# Patient Record
Sex: Female | Born: 1960 | Race: White | Hispanic: No | Marital: Married | Smoking: Current every day smoker
Health system: Southern US, Community
[De-identification: ages and names within clinical notes are randomized; demographics above are authoritative.]

## PROBLEM LIST (undated history)

## (undated) DIAGNOSIS — K219 Gastro-esophageal reflux disease without esophagitis: Secondary | ICD-10-CM

## (undated) DIAGNOSIS — J45990 Exercise induced bronchospasm: Secondary | ICD-10-CM

## (undated) DIAGNOSIS — C801 Malignant (primary) neoplasm, unspecified: Secondary | ICD-10-CM

## (undated) DIAGNOSIS — C569 Malignant neoplasm of unspecified ovary: Secondary | ICD-10-CM

## (undated) DIAGNOSIS — F419 Anxiety disorder, unspecified: Secondary | ICD-10-CM

## (undated) DIAGNOSIS — N289 Disorder of kidney and ureter, unspecified: Secondary | ICD-10-CM

## (undated) HISTORY — DX: Anxiety disorder, unspecified: F41.9

## (undated) HISTORY — DX: Disorder of kidney and ureter, unspecified: N28.9

## (undated) HISTORY — DX: Exercise induced bronchospasm: J45.990

## (undated) HISTORY — DX: Gastro-esophageal reflux disease without esophagitis: K21.9

## (undated) HISTORY — DX: Malignant neoplasm of unspecified ovary: C56.9

---
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
## 2004-10-05 ENCOUNTER — Ambulatory Visit: Payer: Self-pay | Admitting: Family Medicine

## 2005-04-20 ENCOUNTER — Ambulatory Visit: Payer: Self-pay | Admitting: Family Medicine

## 2005-05-19 ENCOUNTER — Ambulatory Visit: Payer: Self-pay | Admitting: Family Medicine

## 2005-07-05 ENCOUNTER — Ambulatory Visit: Payer: Self-pay | Admitting: Family Medicine

## 2012-11-15 ENCOUNTER — Ambulatory Visit: Payer: Self-pay

## 2013-03-16 ENCOUNTER — Ambulatory Visit: Payer: Self-pay

## 2014-04-22 ENCOUNTER — Ambulatory Visit: Payer: Self-pay | Admitting: Gastroenterology

## 2015-02-23 ENCOUNTER — Other Ambulatory Visit: Payer: Self-pay | Admitting: Unknown Physician Specialty

## 2015-03-18 DIAGNOSIS — F419 Anxiety disorder, unspecified: Secondary | ICD-10-CM

## 2015-03-18 DIAGNOSIS — K219 Gastro-esophageal reflux disease without esophagitis: Secondary | ICD-10-CM

## 2015-03-18 DIAGNOSIS — J45990 Exercise induced bronchospasm: Secondary | ICD-10-CM

## 2015-03-18 HISTORY — DX: Gastro-esophageal reflux disease without esophagitis: K21.9

## 2015-03-18 HISTORY — DX: Anxiety disorder, unspecified: F41.9

## 2015-03-18 HISTORY — DX: Exercise induced bronchospasm: J45.990

## 2015-05-06 ENCOUNTER — Other Ambulatory Visit: Payer: Self-pay | Admitting: Unknown Physician Specialty

## 2015-08-03 ENCOUNTER — Other Ambulatory Visit: Payer: Self-pay | Admitting: Family Medicine

## 2015-08-04 NOTE — Telephone Encounter (Signed)
Apt cw 

## 2015-08-04 NOTE — Telephone Encounter (Signed)
Needs appt with Malachy Mood

## 2015-11-02 ENCOUNTER — Other Ambulatory Visit: Payer: Self-pay | Admitting: Family Medicine

## 2015-11-02 NOTE — Telephone Encounter (Signed)
apt 

## 2015-11-04 ENCOUNTER — Encounter: Payer: Self-pay | Admitting: Family Medicine

## 2015-11-11 DIAGNOSIS — Z131 Encounter for screening for diabetes mellitus: Secondary | ICD-10-CM | POA: Insufficient documentation

## 2015-11-11 DIAGNOSIS — Z13220 Encounter for screening for lipoid disorders: Secondary | ICD-10-CM | POA: Insufficient documentation

## 2015-11-11 DIAGNOSIS — Z1159 Encounter for screening for other viral diseases: Secondary | ICD-10-CM | POA: Insufficient documentation

## 2016-01-12 DIAGNOSIS — N289 Disorder of kidney and ureter, unspecified: Secondary | ICD-10-CM

## 2016-01-12 HISTORY — DX: Disorder of kidney and ureter, unspecified: N28.9

## 2016-01-21 ENCOUNTER — Ambulatory Visit
Admission: RE | Admit: 2016-01-21 | Discharge: 2016-01-21 | Disposition: A | Discharge status: Home / Self Care | Payer: BLUE CROSS/BLUE SHIELD | Source: Ambulatory Visit | Attending: Physician Assistant | Admitting: Physician Assistant

## 2016-01-21 ENCOUNTER — Other Ambulatory Visit: Payer: Self-pay | Admitting: Physician Assistant

## 2016-01-21 DIAGNOSIS — N289 Disorder of kidney and ureter, unspecified: Secondary | ICD-10-CM

## 2016-01-21 DIAGNOSIS — R918 Other nonspecific abnormal finding of lung field: Secondary | ICD-10-CM

## 2016-01-21 DIAGNOSIS — M545 Low back pain, unspecified: Secondary | ICD-10-CM

## 2016-01-21 DIAGNOSIS — M5136 Other intervertebral disc degeneration, lumbar region: Secondary | ICD-10-CM | POA: Diagnosis not present

## 2016-01-21 DIAGNOSIS — R109 Unspecified abdominal pain: Secondary | ICD-10-CM | POA: Diagnosis present

## 2016-01-21 DIAGNOSIS — R195 Other fecal abnormalities: Secondary | ICD-10-CM | POA: Insufficient documentation

## 2016-01-21 DIAGNOSIS — D49519 Neoplasm of unspecified behavior of unspecified kidney: Secondary | ICD-10-CM

## 2016-01-21 DIAGNOSIS — R93421 Abnormal radiologic findings on diagnostic imaging of right kidney: Secondary | ICD-10-CM | POA: Insufficient documentation

## 2016-01-21 DIAGNOSIS — N200 Calculus of kidney: Secondary | ICD-10-CM | POA: Insufficient documentation

## 2016-01-22 ENCOUNTER — Ambulatory Visit
Admission: RE | Admit: 2016-01-22 | Discharge: 2016-01-22 | Disposition: A | Discharge status: Home / Self Care | Payer: BLUE CROSS/BLUE SHIELD | Source: Ambulatory Visit | Attending: Physician Assistant | Admitting: Physician Assistant

## 2016-01-22 ENCOUNTER — Telehealth: Payer: Self-pay

## 2016-01-22 DIAGNOSIS — R918 Other nonspecific abnormal finding of lung field: Secondary | ICD-10-CM | POA: Diagnosis present

## 2016-01-22 DIAGNOSIS — D49519 Neoplasm of unspecified behavior of unspecified kidney: Secondary | ICD-10-CM | POA: Diagnosis not present

## 2016-01-22 DIAGNOSIS — I2510 Atherosclerotic heart disease of native coronary artery without angina pectoris: Secondary | ICD-10-CM | POA: Diagnosis not present

## 2016-01-22 DIAGNOSIS — M899 Disorder of bone, unspecified: Secondary | ICD-10-CM | POA: Insufficient documentation

## 2016-01-22 DIAGNOSIS — M545 Low back pain, unspecified: Secondary | ICD-10-CM

## 2016-01-22 DIAGNOSIS — I700 Atherosclerosis of aorta: Secondary | ICD-10-CM | POA: Insufficient documentation

## 2016-01-22 DIAGNOSIS — R93421 Abnormal radiologic findings on diagnostic imaging of right kidney: Secondary | ICD-10-CM | POA: Insufficient documentation

## 2016-01-22 DIAGNOSIS — R109 Unspecified abdominal pain: Secondary | ICD-10-CM

## 2016-01-22 DIAGNOSIS — R932 Abnormal findings on diagnostic imaging of liver and biliary tract: Secondary | ICD-10-CM | POA: Insufficient documentation

## 2016-01-22 HISTORY — DX: Malignant (primary) neoplasm, unspecified: C80.1

## 2016-01-22 MED ORDER — IOPAMIDOL (ISOVUE-300) INJECTION 61%
100.0000 mL | Freq: Once | INTRAVENOUS | Status: AC | PRN
  Administered 2016-01-22: 100 mL via INTRAVENOUS

## 2016-01-22 NOTE — Telephone Encounter (Signed)
  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 Appt has been arranged with Dr Mike Gip 9/12 at 9:15. Voicemail left for Kimberly [name] to return call to notify of appt.

## 2016-01-22 NOTE — Telephone Encounter (Signed)
  Oncology Nurse Navigator Documentation  Navigator Location: [facility] (01/22/16 1626) Navigator Encounter Type: Telephone (01/22/16 1626) Telephone: Incoming Call;Appt Confirmation/Clarification (01/22/16 1626)                                        Time Spent with Patient: 15 (01/22/16 1626)   Notified of appt 9/12 at 9:15 with arrival time of 9:00 with Dr Mike Gip. Readback performed.

## 2016-01-23 ENCOUNTER — Telehealth: Payer: Self-pay | Admitting: Radiology

## 2016-01-23 ENCOUNTER — Encounter: Payer: Self-pay | Admitting: Urology

## 2016-01-23 ENCOUNTER — Ambulatory Visit (INDEPENDENT_AMBULATORY_CARE_PROVIDER_SITE_OTHER): Payer: BLUE CROSS/BLUE SHIELD | Admitting: Urology

## 2016-01-23 VITALS — BP 171/108 | HR 81 | Ht 65.0 in | Wt 143.0 lb

## 2016-01-23 DIAGNOSIS — N2889 Other specified disorders of kidney and ureter: Secondary | ICD-10-CM

## 2016-01-23 DIAGNOSIS — C7800 Secondary malignant neoplasm of unspecified lung: Secondary | ICD-10-CM | POA: Diagnosis not present

## 2016-01-23 DIAGNOSIS — C801 Malignant (primary) neoplasm, unspecified: Secondary | ICD-10-CM

## 2016-01-23 NOTE — Progress Notes (Signed)
01/23/2016 4:37 PM   Kimberly [name] 1960/07/14 [id number]  Referring provider: Glendon Axe, MD [facility] [address], [place] [address]  Chief Complaint  Patient presents with  . 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 She also has evidence of renal vein involvement. CT scan is suspicious for differential including primary transitional cell carcinoma versus renal cell carcinoma.  She also has a appointment scheduled at the cancer center with Dr. Mike Gip on September 12.  She has had right flank pain for the past 4 months.  At first, she thought she injured something in her back while helping her mother move.  Her pain failed to resolve.  She describes the pain as nagging and pulsatile.    She does have a personal history ovarian cancer as teenage s/p hysterectomy, [place] and radiation.  She had no recurrent.  Stage unknown.  Previously followed by Dr. Jeb Levering.    She is a smoker, smoked as much as 1 pack per week.  Quit recently.    Decreased appetitive with minimal weight loss. No nightsweats.  Excellent functional status.    Denies urinary symptoms.     PMH: Past Medical History:  Diagnosis Date  . Anxiety 03/18/2015  . Cancer ([place])    Ovarian cancer at age 45  . Exercise-induced asthma 03/18/2015  . Gastroesophageal reflux disease without esophagitis 03/18/2015  . Renal insufficiency 01/12/2016    Surgical History: Past Surgical History:  Procedure Laterality Date  . ABDOMINAL HYSTERECTOMY  1979    Home Medications:    Medication List       Accurate as of 01/23/16 11:59 PM. Always use your most recent med list.          AMITIZA PO Take by mouth.   citalopram 10 MG tablet Commonly known as:  CELEXA   fentaNYL 12 MCG/HR Commonly known as:  DURAGESIC - dosed  mcg/hr   montelukast 10 MG tablet Commonly known as:  SINGULAIR TAKE 1 TABLET DAILY   pantoprazole 40 MG tablet Commonly known as:  PROTONIX   PROVENTIL HFA 108 (90 Base) MCG/ACT inhaler Generic drug:  albuterol USE 2 INHALATIONS FOUR TIMES A DAY AS NEEDED       Allergies: No Known Allergies  Family History: Family History  Problem Relation Age of Onset  . Prostate cancer Neg Hx   . Bladder Cancer Neg Hx   . Kidney cancer Neg Hx     Social History:  reports that she has been smoking Cigarettes.  She has been smoking about 0.25 packs per day. She has never used smokeless tobacco. She reports that she drinks alcohol. She reports that she does not use drugs.  ROS: UROLOGY Frequent Urination?: No Hard to postpone urination?: No Burning/pain with urination?: No Get up at night to urinate?: No Leakage of urine?: No Urine stream starts and stops?: No Trouble starting stream?: No Do you have to strain to urinate?: No Blood in urine?: No Urinary tract infection?: No Sexually transmitted disease?: No Injury to kidneys or bladder?: No Painful intercourse?: No Weak stream?: No Currently pregnant?: No Vaginal bleeding?: No Last menstrual period?: hysterectomy  Gastrointestinal Nausea?: No Vomiting?: No Indigestion/heartburn?: Yes Diarrhea?: No Constipation?: Yes  Constitutional Fever: No Night sweats?: No Weight loss?: No Fatigue?: No  Skin Skin rash/lesions?: No Itching?:  No  Eyes Blurred vision?: No Double vision?: No  Ears/Nose/Throat Sore throat?: No Sinus problems?: No  Hematologic/Lymphatic Swollen glands?: No Easy bruising?: No  Cardiovascular Leg swelling?: No Chest pain?: No  Respiratory Cough?: No Shortness of breath?: No  Endocrine Excessive thirst?: No  Musculoskeletal Back pain?: No Joint pain?: No  Neurological Headaches?: No Dizziness?: No  Psychologic Depression?: Yes Anxiety?: Yes  Physical Exam: BP (!) 171/108    Pulse 81   Ht 5' 5"  (1.651 m)   Wt 143 lb (64.9 kg)   BMI 23.80 kg/m   Constitutional:  Alert and oriented, No acute distress.  Presents with husband. HEENT: [place] AT, moist mucus membranes.  Trachea midline, no masses. Cardiovascular: No clubbing, cyanosis, or edema. Respiratory: Normal respiratory effort, no increased work of breathing. GI: Abdomen is soft, nontender, nondistended, no abdominal masses GU: No CVA tenderness.  Skin: No rashes, bruises or suspicious lesions. Neurologic: Grossly intact, no focal deficits, moving all 4 extremities. Psychiatric: Normal mood and affect.  Laboratory Data: Comprehensive Metabolic Panel (CMP) (42/70/6237 8:05 AM) Comprehensive Metabolic Panel (CMP) (62/83/1517 8:05 AM)  Component Value Ref Range  Glucose 90 70 - 110 mg/dL  Sodium 142 136 - 145 mmol/L  Potassium 4.3 3.6 - 5.1 mmol/L  Chloride 106 97 - 109 mmol/L  Carbon Dioxide (CO2) 29.3 22.0 - 32.0 mmol/L  Urea Nitrogen (BUN) 11 7 - 25 mg/dL  Creatinine 1.2 (H) 0.6 - 1.1 mg/dL  Glomerular Filtration Rate (eGFR), MDRD Estimate 47 (L) >60 mL/min/1.73sq m  Calcium 9.6 8.7 - 10.3 mg/dL  AST  13 8 - 39 U/L  ALT  9 5 - 38 U/L  Alk Phos (alkaline Phosphatase) 62 34 - 104 U/L  Albumin 3.9 3.5 - 4.8 g/dL  Bilirubin, Total 0.3 0.3 - 1.2 mg/dL  Protein, Total 6.7 6.1 - 7.9 g/dL    Complete Blood Count (CBC) (01/05/2016 8:05 AM) Complete Blood Count (CBC) (01/05/2016 8:05 AM)  Component Value Ref Range  WBC (White Blood Cell Count) 7.6 4.1 - 10.2 10^3/uL  RBC (Red Blood Cell Count) 4.08 4.04 - 5.48 10^6/uL  Hemoglobin 12.0 12.0 - 15.0 gm/dL  Hematocrit 37.1 35.0 - 47.0 %  MCV (Mean Corpuscular Volume) 90.9 80.0 - 100.0 fl  MCH (Mean Corpuscular Hemoglobin) 29.4 27.0 - 31.2 pg  MCHC (Mean Corpuscular Hemoglobin Concentration) 32.3 32.0 - 36.0 gm/dL  Platelet Count 415 150 - 450 10^3/uL  RDW-CV (Red Cell Distribution Width) 12.8 11.6 - 14.8 %  MPV (Mean Platelet Volume) 10.0 9.4 - 12.4 fl      Pertinent Imaging: CLINICAL DATA:  Pulmonary nodules; right flank pain. History of ovarian carcinoma  EXAM: CT CHEST, ABDOMEN, AND PELVIS WITH CONTRAST  TECHNIQUE: Multidetector CT imaging of the chest, abdomen and pelvis was performed following the standard protocol during bolus administration of intravenous contrast. Oral contrast was administered for the CT abdomen and pelvis study.  CONTRAST:  156m ISOVUE-300 IOPAMIDOL (ISOVUE-300) INJECTION 61%  COMPARISON:  CT abdomen and pelvis January 21, 2016 and November 15, 2012  FINDINGS: CT CHEST FINDINGS  Cardiovascular: There is no appreciable thoracic aortic aneurysm or dissection. The visualized great vessels appear unremarkable. The pericardium is not thickened. There are multiple foci of coronary artery calcification.  Mediastinum/Nodes: The visualized thyroid appears normal. There is left hilar adenopathy measuring 2.4 x 1.8 cm. There is a cystic structure in the sub- carinal region measuring 2.9 x 3.1 cm, possibly a duplication type cyst. There is a small hiatal hernia.  Lungs/Pleura: There  are nodular lesions throughout the lungs bilaterally. Nodular lesions range in size from as small as 3 mm to as large as 1.9 x 1.4 cm. These lesions are seen throughout the lungs bilaterally involving most lobes and segments. There is no edema or consolidation.  Musculoskeletal: There is a lytic lesion in the left humeral head. Cystic areas in each scapula most likely are of arthropathic etiology is a post to metastatic foci. There are multiple subtle lytic lesions throughout the thoracic spine. There is a hemangioma occupying most of the T5 vertebral body. There is a more subtle hemangioma in the T3 vertebral body. There is a smaller hemangioma in the leftward aspect of the T11 vertebral body.  CT ABDOMEN PELVIS FINDINGS  Hepatobiliary: Liver measures 19.8 cm in length. There is a focal enhancing lesion in the  dome of the liver measuring 1.1 x 1.1 cm. There is an area of decreased attenuation near the dome the liver posteriorly on the right measuring 1.3 x 0.9 cm. No other focal liver lesions are identified. Gallbladder wall is not appreciably thickened. There is no biliary duct dilatation.  Pancreas: No pancreatic mass or inflammatory focus is evident.  Spleen: No splenic lesion evident.  Adrenals/Urinary Tract: There is a 1.4 x 1.4 cm mass in the left adrenal, stable from 2014 and likely an adenoma. No right adrenal lesion evident. The left kidney contains a 7 mm cyst in the upper pole region. There is a 1.3 x 1.0 cm cyst along the periphery of the mid left kidney. There is a 1.6 x 1.2 cm lower pole left renal cyst. There is no appreciable hydronephrosis on the left. There is no ureteral calculus on the left. On the right, the kidney shows diffuse abnormal enhancement with perinephric inflammation and increased attenuation material throughout the renal pelvis. Superior to the renal pelvis, there are several cystic areas, largest measuring 3.2 x 2.7 cm. Hydronephrosis. There is irregular thickening thickening throughout the right renal vein extending into the inferior vena cava consistent with renal vein thrombosis. There is a shaggy appearance to the right renal vein. There is inflammatory appearing material surrounding the right renal artery. The right renal artery is patent. There are no renal or ureteral calculi on either side. The urinary bladder is midline with wall thickness within normal limits.  Stomach/Bowel: There is no appreciable bowel wall or mesenteric thickening. There is lipomatous infiltration of the ileocecal valve. There is no bowel obstruction. No free air or portal venous air.  Vascular/Lymphatic: There is atherosclerotic calcification in the aorta. There is no abdominal aortic aneurysm. The major mesenteric vessels are patent with the exceptions of the  abnormalities in the right renal artery and vein noted above. There is a focal lymph node there appears to be narrowing of the inferior vena cava superior to the right renal vein. As noted above. There is question of tumor or inflammatory material extending into the inferior vena cava via the right renal vein. There is a focal lymph node between the aorta and inferior vena cava measuring 1.2 x 1.0 cm. There are several smaller retroperitoneal lymph nodes. No other adenopathy is appreciable in the abdomen and pelvis.  Reproductive: The uterus and ovaries appear absent. There is no pelvic mass or pelvic fluid collection.  Other: There is no periappendiceal region inflammation. There are no omental lesions evident. There is no ascites in the abdomen or pelvis. No well-defined abscess is identified in the abdomen or pelvis.  Musculoskeletal: There are several small lytic  lesions in the lumbar spine consistent with metastatic foci. There is degenerative change in the lower lumbar spine. There are no intramuscular or abdominal wall lesions.  IMPRESSION: CT chest: Multiple nodular lesions throughout the lungs, most likely due to metastatic foci. There is left hilar adenopathy, likely of neoplastic etiology. Question duplication cyst in the middle mediastinum in the sub- carinal region. Subtle lytic bone lesions noted at multiple sites in the thoracic spine and proximal left humerus. Cystic areas in each scapula most likely are of arthropathic etiology.  There are multiple foci of coronary artery calcification noted.  CT abdomen and pelvis: There is diffuse abnormality involving the right kidney with perinephric stranding. There is abnormal enhancing material throughout the collecting system and proximal ureter on the right with shaggy enhancing material encasing the right renal artery vein. The right renal artery remains patent. The right renal vein appears thrombosed. There is  extension of this material into the inferior vena cava. The appearance of the right kidney and collecting system raises concern for neoplasm with transitional cell carcinoma a consideration given the collecting system involvement. An atypical presentation of renal cell carcinomas possible. There is evidence of an inflammatory focus associated with these changes, and inflammatory type carcinoma is felt to be most likely. An unusual infection could present in this manner, although given the other findings in the chest and abdomen, neoplasm is certainly felt to be more likely. There is mild adenopathy in the retroperitoneum.  Suspect small liver metastases as noted above. There are subtle lytic bony lesions in the lumbar spine.  No bowel obstruction. No renal or ureteral calculi. There is aortic atherosclerosis.  Given these findings, urologic consultation advised.  These results will be called to the ordering clinician or representative by the Radiologist Assistant, and communication documented in the PACS or zVision Dashboard.   Electronically Signed   By: Lowella Grip III M.D.   On: 01/22/2016 09:35  CT scan personally reviewed today  Assessment & Plan:    1. Renal mass CT scan personally reviewed today with the patient.  The appearance of the lesion is suspicious for primary renal carcinoma, either an atypical renal cell carcinoma involving the renal vein without a clear circumscribed tumor versus transitional cell carcinoma. There is evidence of metastatic spread which is primarily to the lung but also suggestive in the liver as well.  Stage IV.   CT scan was reviewed with Dr. Pascal Lux of interventional radiology who is agreed to proceed with CT-guided lung biopsy to be arranged ASAP. Case was also discussed with the cancer center and nurse nativator Mariea Clonts.  Mainstay of treatment will likely be chemotherapy with possible (less likely) role of cytoreductive  nephrectomy pending the pathology report.  Follow-up Ms. Balles in her husband's questions were answered. Discussion today was lengthy.    2. 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 Follow up with cancer center shortly thereafter.  - CT BIOPSY; Future   Follow up with cancer center, will follow along and   Hollice Espy, MD  [facility] [address] [place], [place] [address] [phone]  I spent 45 min with this patient of which greater than 50% was spent in counseling and coordination of care with the patient.

## 2016-01-23 NOTE — Telephone Encounter (Signed)
Notified pt of lung biopsy scheduled in IR at [facility] on 01/26/16. Advised pt of arrival time of 10:00 to the [place] registration desk, to be npo after mn including medications and to have a driver present for discharge. Pt voices understanding.

## 2016-01-26 ENCOUNTER — Telehealth: Payer: Self-pay

## 2016-01-26 ENCOUNTER — Ambulatory Visit
Admission: RE | Admit: 2016-01-26 | Discharge: 2016-01-26 | Disposition: A | Discharge status: Home / Self Care | Payer: BLUE CROSS/BLUE SHIELD | Source: Ambulatory Visit | Attending: Urology | Admitting: Urology

## 2016-01-26 ENCOUNTER — Telehealth: Payer: Self-pay | Admitting: *Deleted

## 2016-01-26 ENCOUNTER — Ambulatory Visit
Admission: RE | Admit: 2016-01-26 | Discharge: 2016-01-26 | Disposition: A | Discharge status: Home / Self Care | Payer: BLUE CROSS/BLUE SHIELD | Source: Ambulatory Visit | Attending: Interventional Radiology | Admitting: Interventional Radiology

## 2016-01-26 DIAGNOSIS — J95811 Postprocedural pneumothorax: Secondary | ICD-10-CM | POA: Diagnosis not present

## 2016-01-26 DIAGNOSIS — Z8051 Family history of malignant neoplasm of kidney: Secondary | ICD-10-CM | POA: Insufficient documentation

## 2016-01-26 DIAGNOSIS — C689 Malignant neoplasm of urinary organ, unspecified: Secondary | ICD-10-CM | POA: Insufficient documentation

## 2016-01-26 DIAGNOSIS — C7800 Secondary malignant neoplasm of unspecified lung: Secondary | ICD-10-CM | POA: Insufficient documentation

## 2016-01-26 DIAGNOSIS — F419 Anxiety disorder, unspecified: Secondary | ICD-10-CM | POA: Diagnosis not present

## 2016-01-26 DIAGNOSIS — K219 Gastro-esophageal reflux disease without esophagitis: Secondary | ICD-10-CM | POA: Insufficient documentation

## 2016-01-26 DIAGNOSIS — F17210 Nicotine dependence, cigarettes, uncomplicated: Secondary | ICD-10-CM | POA: Diagnosis not present

## 2016-01-26 DIAGNOSIS — Z90710 Acquired absence of both cervix and uterus: Secondary | ICD-10-CM | POA: Diagnosis not present

## 2016-01-26 DIAGNOSIS — Z8543 Personal history of malignant neoplasm of ovary: Secondary | ICD-10-CM | POA: Insufficient documentation

## 2016-01-26 DIAGNOSIS — C801 Malignant (primary) neoplasm, unspecified: Secondary | ICD-10-CM | POA: Insufficient documentation

## 2016-01-26 DIAGNOSIS — Z9889 Other specified postprocedural states: Secondary | ICD-10-CM | POA: Insufficient documentation

## 2016-01-26 DIAGNOSIS — Z8052 Family history of malignant neoplasm of bladder: Secondary | ICD-10-CM | POA: Diagnosis not present

## 2016-01-26 DIAGNOSIS — Z8042 Family history of malignant neoplasm of prostate: Secondary | ICD-10-CM | POA: Insufficient documentation

## 2016-01-26 LAB — APTT: aPTT: 37 seconds — ABNORMAL HIGH (ref 24–36)

## 2016-01-26 LAB — CBC
HEMATOCRIT: 34.4 % — AB (ref 35.0–47.0)
HEMOGLOBIN: 11.9 g/dL — AB (ref 12.0–16.0)
MCH: 30.2 pg (ref 26.0–34.0)
MCHC: 34.7 g/dL (ref 32.0–36.0)
MCV: 87.1 fL (ref 80.0–100.0)
Platelets: 336 10*3/uL (ref 150–440)
RBC: 3.94 MIL/uL (ref 3.80–5.20)
RDW: 13.6 % (ref 11.5–14.5)
WBC: 6.8 10*3/uL (ref 3.6–11.0)

## 2016-01-26 LAB — PROTIME-INR
INR: 1.04
Prothrombin Time: 13.6 seconds (ref 11.4–15.2)

## 2016-01-26 MED ORDER — FENTANYL CITRATE (PF) 100 MCG/2ML IJ SOLN
INTRAMUSCULAR | Status: AC
  Filled 2016-01-26: qty 4

## 2016-01-26 MED ORDER — MIDAZOLAM HCL 5 MG/5ML IJ SOLN
INTRAMUSCULAR | Status: AC
  Filled 2016-01-26: qty 10

## 2016-01-26 MED ORDER — MIDAZOLAM HCL 5 MG/5ML IJ SOLN
INTRAMUSCULAR | Status: AC | PRN
  Administered 2016-01-26: 0.5 mg via INTRAVENOUS
  Administered 2016-01-26 (×2): 1 mg via INTRAVENOUS
  Administered 2016-01-26: 0.5 mg via INTRAVENOUS

## 2016-01-26 MED ORDER — SODIUM CHLORIDE 0.9 % IV SOLN
INTRAVENOUS | Status: DC
  Administered 2016-01-26: 11:00:00 via INTRAVENOUS

## 2016-01-26 MED ORDER — KETOROLAC TROMETHAMINE 30 MG/ML IJ SOLN
30.0000 mg | Freq: Once | INTRAMUSCULAR | Status: AC
  Administered 2016-01-26: 30 mg via INTRAVENOUS

## 2016-01-26 MED ORDER — FENTANYL CITRATE (PF) 100 MCG/2ML IJ SOLN
INTRAMUSCULAR | Status: AC | PRN
  Administered 2016-01-26: 25 ug via INTRAVENOUS
  Administered 2016-01-26: 50 ug via INTRAVENOUS

## 2016-01-26 MED ORDER — KETOROLAC TROMETHAMINE 60 MG/2ML IM SOLN
INTRAMUSCULAR | Status: DC
Start: 2016-01-26 — End: 2016-01-27
  Filled 2016-01-26: qty 2

## 2016-01-26 NOTE — Progress Notes (Signed)
Pt. Asleep post Toradol for pain.

## 2016-01-26 NOTE — Telephone Encounter (Signed)
Patient had biopsy today and has to stop Excedrin for pain relief.  Can fentanyl be increased and do you want her to keep her appointment tomorrow

## 2016-01-26 NOTE — Telephone Encounter (Signed)
  Oncology Nurse Navigator Documentation  Navigator Location: [facility] (01/26/16 1200) Navigator Encounter Type: Telephone (01/26/16 1200) Telephone: Lahoma Crocker Call;Appt Confirmation/Clarification (01/26/16 1200)                                        Time Spent with Patient: 15 (01/26/16 1200)   Patient having CT guided biopsy today for diagnosis. Appt for 9/12 with Dr Mike Gip changed to 9/15 at 10:15 so that biopsy results will be available. Voicemail left with Ms. Castrillon. Asked for her to return call to confirm.

## 2016-01-26 NOTE — OR Nursing (Signed)
Called cancer Center left message about pt request for stronger pain meds, she was instructed to hold Excedrin for 24 hrs (since she was taking 2 tablets every 6 hours around clock in addition to Fentanyl patch).

## 2016-01-26 NOTE — Consult Note (Signed)
Chief Complaint: Multiple lung nodules  Referring Physician(s): Hollice Espy  Supervising Physician: Corrie Mckusick  Patient Status: Outpatient  History of Present Illness: Kimberly [name] is a 55 y.o. female presenting today for a scheduled lung biopsy of nodule, with concern for mets.   Past Medical History:  Diagnosis Date  . Anxiety 03/18/2015  . Cancer ([place])    Ovarian cancer at age 40  . Exercise-induced asthma 03/18/2015  . Gastroesophageal reflux disease without esophagitis 03/18/2015  . Renal insufficiency 01/12/2016    Past Surgical History:  Procedure Laterality Date  . ABDOMINAL HYSTERECTOMY  1979    Allergies: Review of patient's allergies indicates no known allergies.  Medications: Prior to Admission medications   Medication Sig Start Date End Date Taking? Authorizing Provider  diphenhydramine-acetaminophen (TYLENOL PM EXTRA STRENGTH) 25-500 MG TABS tablet Take 2 tablets by mouth at bedtime as needed.   Yes Historical Provider, MD  Lubiprostone (AMITIZA PO) Take by mouth.   Yes Historical Provider, MD  montelukast (SINGULAIR) 10 MG tablet TAKE 1 TABLET DAILY 02/24/15  Yes Kathrine Haddock, NP  pantoprazole (PROTONIX) 40 MG tablet  01/12/16  Yes Historical Provider, MD  PROVENTIL HFA 108 (90 Base) MCG/ACT inhaler USE 2 INHALATIONS FOUR TIMES A DAY AS NEEDED 11/02/15  Yes Guadalupe Maple, MD  citalopram (CELEXA) 10 MG tablet  12/07/15   Historical Provider, MD  fentaNYL (DURAGESIC - DOSED MCG/HR) 12 MCG/HR  01/22/16   Historical Provider, MD     Family History  Problem Relation Age of Onset  . Prostate cancer Neg Hx   . Bladder Cancer Neg Hx   . Kidney cancer Neg Hx     Social History   Social History  . Marital status: Married    Spouse name: N/A  . Number of children: N/A  . Years of education: N/A   Social History Main Topics  . Smoking status: Current Every Day Smoker    Packs/day: 0.25    Types: Cigarettes  . Smokeless tobacco: Never Used  .  Alcohol use Yes     Comment: occasional  . Drug use: No  . Sexual activity: Not Asked   Other Topics Concern  . None   Social History Narrative  . None      Review of Systems: A 12 point ROS discussed and pertinent positives are indicated in the HPI above.  All other systems are negative.  Review of Systems  Vital Signs: BP (!) 165/112   Temp 98.6 F (37 C)   Resp 13   SpO2 95%   Physical Exam  Mallampati Score:     Imaging: Ct Abdomen Pelvis Wo Contrast  Result Date: 01/21/2016 CLINICAL DATA:  Acute low back pain, acute kidney insufficiency, evaluate for kidney stone EXAM: CT ABDOMEN AND PELVIS WITHOUT CONTRAST TECHNIQUE: Multidetector CT imaging of the abdomen and pelvis was performed following the standard protocol without IV contrast. COMPARISON:  None. FINDINGS: Lower chest: Multiple bilateral pulmonary nodules are noted. The largest nodule in right lower lobe posteriorly measures 2.3 cm. Highly suspicious for metastatic disease. Hepatobiliary: Unenhanced liver shows no biliary ductal dilatation. Gallbladder is contracted. Pancreas: Unenhanced pancreas is unremarkable. Spleen: Unenhanced spleen is unremarkable. Adrenals/Urinary Tract: Low-density nodule left adrenal gland is stable. There is right perinephric stranding. Significant heterogeneous right kidney. High-density material is noted in right renal pelvis and proximal right ureter. Findings are highly suspicious for diffuse infectious inflammatory or neoplastic process of the right kidney. Clinical correlation is necessary. Further evaluation with  enhanced CT or MRI is recommended. If the patient cannot have contrast correlation with urology exam or retrograde study is recommended. No calcified ureteral calculi are noted bilaterally. Punctate nonobstructive calcification midpole of the right kidney measures 1.4 mm. The urinary bladder is empty limiting its assessment. Bilateral distal ureter is unremarkable. Stomach/Bowel:  Small hiatal hernia. No small bowel obstruction. Moderate stool noted in right colon and transverse colon. The terminal ileum is unremarkable. The cecum is located in mid abdomen. No pericecal inflammation. Appendix is not identified. No distal colitis or diverticulitis. Vascular/Lymphatic: No aortic aneurysm. No retroperitoneal or mesenteric adenopathy. Reproductive: The uterus and ovaries are not identified. Other: No ascites or free abdominal air.  No inguinal adenopathy. Musculoskeletal: Degenerative changes lumbar spine are noted. There is disc space flattening with vacuum disc phenomenon at L4-L5 and L5-S1 level. IMPRESSION: 1. Multiple pulmonary nodules are noted bilaterally highly suspicious for metastatic disease. Further evaluation with CT scan of the chest is recommended. 2. There is heterogeneous right kidney with stranding of surrounding fat. Diffuse infection, inflammation or neoplastic process is suspected. High-density material is noted in right renal pelvis and proximal right ureter. Blood products or tumor cannot be excluded. Further correlation with enhanced study or urology retrograde study is recommended. 3. No calcified ureteral calculi. There is right nonobstructive nephrolithiasis. No hydronephrosis or hydroureter. 4. Limited assessment of urinary bladder which is empty. 5. Moderate stool noted in right colon and transverse colon. No small bowel or colonic obstruction. 6. Degenerative changes lumbar spine. These results were called by telephone at the time of interpretation on 01/21/2016 at 4:51 pm to nurse of Dr. Paulita Cradle , who verbally acknowledged these results. Electronically Signed   By: Lahoma Crocker M.D.   On: 01/21/2016 16:52   Ct Chest W Contrast  Result Date: 01/22/2016 CLINICAL DATA:  Pulmonary nodules; right flank pain. History of ovarian carcinoma EXAM: CT CHEST, ABDOMEN, AND PELVIS WITH CONTRAST TECHNIQUE: Multidetector CT imaging of the chest, abdomen and pelvis was  performed following the standard protocol during bolus administration of intravenous contrast. Oral contrast was administered for the CT abdomen and pelvis study. CONTRAST:  164mL ISOVUE-300 IOPAMIDOL (ISOVUE-300) INJECTION 61% COMPARISON:  CT abdomen and pelvis January 21, 2016 and November 15, 2012 FINDINGS: CT CHEST FINDINGS Cardiovascular: There is no appreciable thoracic aortic aneurysm or dissection. The visualized great vessels appear unremarkable. The pericardium is not thickened. There are multiple foci of coronary artery calcification. Mediastinum/Nodes: The visualized thyroid appears normal. There is left hilar adenopathy measuring 2.4 x 1.8 cm. There is a cystic structure in the sub- carinal region measuring 2.9 x 3.1 cm, possibly a duplication type cyst. There is a small hiatal hernia. Lungs/Pleura: There are nodular lesions throughout the lungs bilaterally. Nodular lesions range in size from as small as 3 mm to as large as 1.9 x 1.4 cm. These lesions are seen throughout the lungs bilaterally involving most lobes and segments. There is no edema or consolidation. Musculoskeletal: There is a lytic lesion in the left humeral head. Cystic areas in each scapula most likely are of arthropathic etiology is a post to metastatic foci. There are multiple subtle lytic lesions throughout the thoracic spine. There is a hemangioma occupying most of the T5 vertebral body. There is a more subtle hemangioma in the T3 vertebral body. There is a smaller hemangioma in the leftward aspect of the T11 vertebral body. CT ABDOMEN PELVIS FINDINGS Hepatobiliary: Liver measures 19.8 cm in length. There is a focal enhancing lesion in  the dome of the liver measuring 1.1 x 1.1 cm. There is an area of decreased attenuation near the dome the liver posteriorly on the right measuring 1.3 x 0.9 cm. No other focal liver lesions are identified. Gallbladder wall is not appreciably thickened. There is no biliary duct dilatation. Pancreas: No  pancreatic mass or inflammatory focus is evident. Spleen: No splenic lesion evident. Adrenals/Urinary Tract: There is a 1.4 x 1.4 cm mass in the left adrenal, stable from 2014 and likely an adenoma. No right adrenal lesion evident. The left kidney contains a 7 mm cyst in the upper pole region. There is a 1.3 x 1.0 cm cyst along the periphery of the mid left kidney. There is a 1.6 x 1.2 cm lower pole left renal cyst. There is no appreciable hydronephrosis on the left. There is no ureteral calculus on the left. On the right, the kidney shows diffuse abnormal enhancement with perinephric inflammation and increased attenuation material throughout the renal pelvis. Superior to the renal pelvis, there are several cystic areas, largest measuring 3.2 x 2.7 cm. Hydronephrosis. There is irregular thickening thickening throughout the right renal vein extending into the inferior vena cava consistent with renal vein thrombosis. There is a shaggy appearance to the right renal vein. There is inflammatory appearing material surrounding the right renal artery. The right renal artery is patent. There are no renal or ureteral calculi on either side. The urinary bladder is midline with wall thickness within normal limits. Stomach/Bowel: There is no appreciable bowel wall or mesenteric thickening. There is lipomatous infiltration of the ileocecal valve. There is no bowel obstruction. No free air or portal venous air. Vascular/Lymphatic: There is atherosclerotic calcification in the aorta. There is no abdominal aortic aneurysm. The major mesenteric vessels are patent with the exceptions of the abnormalities in the right renal artery and vein noted above. There is a focal lymph node there appears to be narrowing of the inferior vena cava superior to the right renal vein. As noted above. There is question of tumor or inflammatory material extending into the inferior vena cava via the right renal vein. There is a focal lymph node between  the aorta and inferior vena cava measuring 1.2 x 1.0 cm. There are several smaller retroperitoneal lymph nodes. No other adenopathy is appreciable in the abdomen and pelvis. Reproductive: The uterus and ovaries appear absent. There is no pelvic mass or pelvic fluid collection. Other: There is no periappendiceal region inflammation. There are no omental lesions evident. There is no ascites in the abdomen or pelvis. No well-defined abscess is identified in the abdomen or pelvis. Musculoskeletal: There are several small lytic lesions in the lumbar spine consistent with metastatic foci. There is degenerative change in the lower lumbar spine. There are no intramuscular or abdominal wall lesions. IMPRESSION: CT chest: Multiple nodular lesions throughout the lungs, most likely due to metastatic foci. There is left hilar adenopathy, likely of neoplastic etiology. Question duplication cyst in the middle mediastinum in the sub- carinal region. Subtle lytic bone lesions noted at multiple sites in the thoracic spine and proximal left humerus. Cystic areas in each scapula most likely are of arthropathic etiology. There are multiple foci of coronary artery calcification noted. CT abdomen and pelvis: There is diffuse abnormality involving the right kidney with perinephric stranding. There is abnormal enhancing material throughout the collecting system and proximal ureter on the right with shaggy enhancing material encasing the right renal artery vein. The right renal artery remains patent. The right renal vein  appears thrombosed. There is extension of this material into the inferior vena cava. The appearance of the right kidney and collecting system raises concern for neoplasm with transitional cell carcinoma a consideration given the collecting system involvement. An atypical presentation of renal cell carcinomas possible. There is evidence of an inflammatory focus associated with these changes, and inflammatory type carcinoma is  felt to be most likely. An unusual infection could present in this manner, although given the other findings in the chest and abdomen, neoplasm is certainly felt to be more likely. There is mild adenopathy in the retroperitoneum. Suspect small liver metastases as noted above. There are subtle lytic bony lesions in the lumbar spine. No bowel obstruction. No renal or ureteral calculi. There is aortic atherosclerosis. Given these findings, urologic consultation advised. These results will be called to the ordering clinician or representative by the Radiologist Assistant, and communication documented in the PACS or zVision Dashboard. Electronically Signed   By: Lowella Grip III M.D.   On: 01/22/2016 09:35   Ct Abdomen Pelvis W Contrast  Result Date: 01/22/2016 CLINICAL DATA:  Pulmonary nodules; right flank pain. History of ovarian carcinoma EXAM: CT CHEST, ABDOMEN, AND PELVIS WITH CONTRAST TECHNIQUE: Multidetector CT imaging of the chest, abdomen and pelvis was performed following the standard protocol during bolus administration of intravenous contrast. Oral contrast was administered for the CT abdomen and pelvis study. CONTRAST:  136mL ISOVUE-300 IOPAMIDOL (ISOVUE-300) INJECTION 61% COMPARISON:  CT abdomen and pelvis January 21, 2016 and November 15, 2012 FINDINGS: CT CHEST FINDINGS Cardiovascular: There is no appreciable thoracic aortic aneurysm or dissection. The visualized great vessels appear unremarkable. The pericardium is not thickened. There are multiple foci of coronary artery calcification. Mediastinum/Nodes: The visualized thyroid appears normal. There is left hilar adenopathy measuring 2.4 x 1.8 cm. There is a cystic structure in the sub- carinal region measuring 2.9 x 3.1 cm, possibly a duplication type cyst. There is a small hiatal hernia. Lungs/Pleura: There are nodular lesions throughout the lungs bilaterally. Nodular lesions range in size from as small as 3 mm to as large as 1.9 x 1.4 cm. These  lesions are seen throughout the lungs bilaterally involving most lobes and segments. There is no edema or consolidation. Musculoskeletal: There is a lytic lesion in the left humeral head. Cystic areas in each scapula most likely are of arthropathic etiology is a post to metastatic foci. There are multiple subtle lytic lesions throughout the thoracic spine. There is a hemangioma occupying most of the T5 vertebral body. There is a more subtle hemangioma in the T3 vertebral body. There is a smaller hemangioma in the leftward aspect of the T11 vertebral body. CT ABDOMEN PELVIS FINDINGS Hepatobiliary: Liver measures 19.8 cm in length. There is a focal enhancing lesion in the dome of the liver measuring 1.1 x 1.1 cm. There is an area of decreased attenuation near the dome the liver posteriorly on the right measuring 1.3 x 0.9 cm. No other focal liver lesions are identified. Gallbladder wall is not appreciably thickened. There is no biliary duct dilatation. Pancreas: No pancreatic mass or inflammatory focus is evident. Spleen: No splenic lesion evident. Adrenals/Urinary Tract: There is a 1.4 x 1.4 cm mass in the left adrenal, stable from 2014 and likely an adenoma. No right adrenal lesion evident. The left kidney contains a 7 mm cyst in the upper pole region. There is a 1.3 x 1.0 cm cyst along the periphery of the mid left kidney. There is a 1.6 x 1.2 cm  lower pole left renal cyst. There is no appreciable hydronephrosis on the left. There is no ureteral calculus on the left. On the right, the kidney shows diffuse abnormal enhancement with perinephric inflammation and increased attenuation material throughout the renal pelvis. Superior to the renal pelvis, there are several cystic areas, largest measuring 3.2 x 2.7 cm. Hydronephrosis. There is irregular thickening thickening throughout the right renal vein extending into the inferior vena cava consistent with renal vein thrombosis. There is a shaggy appearance to the right  renal vein. There is inflammatory appearing material surrounding the right renal artery. The right renal artery is patent. There are no renal or ureteral calculi on either side. The urinary bladder is midline with wall thickness within normal limits. Stomach/Bowel: There is no appreciable bowel wall or mesenteric thickening. There is lipomatous infiltration of the ileocecal valve. There is no bowel obstruction. No free air or portal venous air. Vascular/Lymphatic: There is atherosclerotic calcification in the aorta. There is no abdominal aortic aneurysm. The major mesenteric vessels are patent with the exceptions of the abnormalities in the right renal artery and vein noted above. There is a focal lymph node there appears to be narrowing of the inferior vena cava superior to the right renal vein. As noted above. There is question of tumor or inflammatory material extending into the inferior vena cava via the right renal vein. There is a focal lymph node between the aorta and inferior vena cava measuring 1.2 x 1.0 cm. There are several smaller retroperitoneal lymph nodes. No other adenopathy is appreciable in the abdomen and pelvis. Reproductive: The uterus and ovaries appear absent. There is no pelvic mass or pelvic fluid collection. Other: There is no periappendiceal region inflammation. There are no omental lesions evident. There is no ascites in the abdomen or pelvis. No well-defined abscess is identified in the abdomen or pelvis. Musculoskeletal: There are several small lytic lesions in the lumbar spine consistent with metastatic foci. There is degenerative change in the lower lumbar spine. There are no intramuscular or abdominal wall lesions. IMPRESSION: CT chest: Multiple nodular lesions throughout the lungs, most likely due to metastatic foci. There is left hilar adenopathy, likely of neoplastic etiology. Question duplication cyst in the middle mediastinum in the sub- carinal region. Subtle lytic bone lesions  noted at multiple sites in the thoracic spine and proximal left humerus. Cystic areas in each scapula most likely are of arthropathic etiology. There are multiple foci of coronary artery calcification noted. CT abdomen and pelvis: There is diffuse abnormality involving the right kidney with perinephric stranding. There is abnormal enhancing material throughout the collecting system and proximal ureter on the right with shaggy enhancing material encasing the right renal artery vein. The right renal artery remains patent. The right renal vein appears thrombosed. There is extension of this material into the inferior vena cava. The appearance of the right kidney and collecting system raises concern for neoplasm with transitional cell carcinoma a consideration given the collecting system involvement. An atypical presentation of renal cell carcinomas possible. There is evidence of an inflammatory focus associated with these changes, and inflammatory type carcinoma is felt to be most likely. An unusual infection could present in this manner, although given the other findings in the chest and abdomen, neoplasm is certainly felt to be more likely. There is mild adenopathy in the retroperitoneum. Suspect small liver metastases as noted above. There are subtle lytic bony lesions in the lumbar spine. No bowel obstruction. No renal or ureteral calculi. There is  aortic atherosclerosis. Given these findings, urologic consultation advised. These results will be called to the ordering clinician or representative by the Radiologist Assistant, and communication documented in the PACS or zVision Dashboard. Electronically Signed   By: Lowella Grip III M.D.   On: 01/22/2016 09:35    Labs:  CBC:  Recent Labs  01/26/16 1014  WBC 6.8  HGB 11.9*  HCT 34.4*  PLT 336    COAGS:  Recent Labs  01/26/16 1014  INR 1.04  APTT 37*    BMP: No results for input(s): NA, K, CL, CO2, GLUCOSE, BUN, CALCIUM, CREATININE,  GFRNONAA, GFRAA in the last 8760 hours.  Invalid input(s): CMP  LIVER FUNCTION TESTS: No results for input(s): BILITOT, AST, ALT, ALKPHOS, PROT, ALBUMIN in the last 8760 hours.  TUMOR MARKERS: No results for input(s): AFPTM, CEA, CA199, CHROMGRNA in the last 8760 hours.  Assessment and Plan:  55 year old female, presenting for lung biopsy of nodule.    Risks and Benefits discussed with the patient including, but not limited to bleeding, hemoptysis, respiratory failure requiring intubation, infection, pneumothorax requiring chest tube placement, stroke from air embolism or even death. All of the patient's questions were answered, patient is agreeable to proceed. Consent signed and in chart.  Thank you for this interesting consult.  I greatly enjoyed meeting Kimberly [name] and look forward to participating in their care.  A copy of this report was sent to the requesting provider on this date.  Electronically Signed: Corrie Mckusick 01/26/2016, 10:53 AM   I spent a total of  15 Minutes   in face to face in clinical consultation, greater than 50% of which was counseling/coordinating care for lung biopsy.

## 2016-01-26 NOTE — Procedures (Signed)
Interventional Radiology Procedure Note  Procedure: CT guided lung nodule biopsy, right lower lobe.  Complications: None Recommendations:  - Ok to shower tomorrow - NPO until DC - CXR in 1 hour - 2 hour observation - Routine care - follow up pathology  Signed,  Dulcy Fanny. Earleen Newport, DO

## 2016-01-26 NOTE — Telephone Encounter (Signed)
Steffanie Dunn called and the person who called was special procedure and they thought pt had already seen md at cancer center and she informed them they would need to call Dr. Holley Raring office he had prescribed the fentanyl.  Also we chagned her appt from tom to Friday and they will pass info to pt..  Also Steffanie Dunn called pt and left her voicemail of the change of appt and why. ( because she jsut had her bx today)

## 2016-01-26 NOTE — Telephone Encounter (Signed)
  Oncology Nurse Navigator Documentation  Navigator Location: [facility] (01/26/16 1400) Navigator Encounter Type: Telephone (01/26/16 1400) Telephone: Lahoma Crocker Call;Symptom Mgt;Appt Confirmation/Clarification (01/26/16 1400)                                        Time Spent with Patient: 15 (01/26/16 1400)   Received call via triage, specials recovery called and stating patient needs increased fentanyl patch for pain control. Notified Ebony Hail in specials that she will need to contact ordering physician since she has not been seen at cancer center yet. Ebony Hail will also notify patient of changed appt to Friday 9/15 at 10:15 with Dr Mike Gip.

## 2016-01-27 ENCOUNTER — Inpatient Hospital Stay: Payer: BLUE CROSS/BLUE SHIELD | Admitting: Hematology and Oncology

## 2016-01-29 LAB — SURGICAL PATHOLOGY

## 2016-01-30 ENCOUNTER — Inpatient Hospital Stay: Payer: BLUE CROSS/BLUE SHIELD

## 2016-01-30 ENCOUNTER — Encounter: Payer: Self-pay | Admitting: Hematology and Oncology

## 2016-01-30 ENCOUNTER — Inpatient Hospital Stay: Payer: BLUE CROSS/BLUE SHIELD | Attending: Hematology and Oncology | Admitting: Hematology and Oncology

## 2016-01-30 ENCOUNTER — Encounter: Payer: Self-pay | Admitting: Emergency Medicine

## 2016-01-30 ENCOUNTER — Telehealth: Payer: Self-pay | Admitting: *Deleted

## 2016-01-30 ENCOUNTER — Emergency Department
Admission: EM | Admit: 2016-01-30 | Discharge: 2016-01-30 | Disposition: A | Discharge status: Home / Self Care | Payer: BLUE CROSS/BLUE SHIELD | Attending: Emergency Medicine | Admitting: Emergency Medicine

## 2016-01-30 DIAGNOSIS — G893 Neoplasm related pain (acute) (chronic): Secondary | ICD-10-CM | POA: Insufficient documentation

## 2016-01-30 DIAGNOSIS — Z808 Family history of malignant neoplasm of other organs or systems: Secondary | ICD-10-CM

## 2016-01-30 DIAGNOSIS — C689 Malignant neoplasm of urinary organ, unspecified: Secondary | ICD-10-CM

## 2016-01-30 DIAGNOSIS — Z8543 Personal history of malignant neoplasm of ovary: Secondary | ICD-10-CM | POA: Insufficient documentation

## 2016-01-30 DIAGNOSIS — Z79899 Other long term (current) drug therapy: Secondary | ICD-10-CM

## 2016-01-30 DIAGNOSIS — R109 Unspecified abdominal pain: Secondary | ICD-10-CM | POA: Diagnosis not present

## 2016-01-30 DIAGNOSIS — K219 Gastro-esophageal reflux disease without esophagitis: Secondary | ICD-10-CM

## 2016-01-30 DIAGNOSIS — F419 Anxiety disorder, unspecified: Secondary | ICD-10-CM | POA: Diagnosis not present

## 2016-01-30 DIAGNOSIS — M545 Low back pain: Secondary | ICD-10-CM | POA: Diagnosis not present

## 2016-01-30 DIAGNOSIS — C7801 Secondary malignant neoplasm of right lung: Secondary | ICD-10-CM | POA: Diagnosis not present

## 2016-01-30 DIAGNOSIS — Z791 Long term (current) use of non-steroidal anti-inflammatories (NSAID): Secondary | ICD-10-CM | POA: Insufficient documentation

## 2016-01-30 DIAGNOSIS — N2889 Other specified disorders of kidney and ureter: Secondary | ICD-10-CM | POA: Diagnosis not present

## 2016-01-30 DIAGNOSIS — Z90722 Acquired absence of ovaries, bilateral: Secondary | ICD-10-CM

## 2016-01-30 DIAGNOSIS — I159 Secondary hypertension, unspecified: Secondary | ICD-10-CM | POA: Diagnosis not present

## 2016-01-30 DIAGNOSIS — Z85528 Personal history of other malignant neoplasm of kidney: Secondary | ICD-10-CM | POA: Diagnosis not present

## 2016-01-30 DIAGNOSIS — J45909 Unspecified asthma, uncomplicated: Secondary | ICD-10-CM | POA: Insufficient documentation

## 2016-01-30 DIAGNOSIS — J45990 Exercise induced bronchospasm: Secondary | ICD-10-CM

## 2016-01-30 DIAGNOSIS — I10 Essential (primary) hypertension: Secondary | ICD-10-CM | POA: Diagnosis not present

## 2016-01-30 DIAGNOSIS — Z807 Family history of other malignant neoplasms of lymphoid, hematopoietic and related tissues: Secondary | ICD-10-CM

## 2016-01-30 DIAGNOSIS — R5383 Other fatigue: Secondary | ICD-10-CM

## 2016-01-30 DIAGNOSIS — M899 Disorder of bone, unspecified: Secondary | ICD-10-CM | POA: Diagnosis not present

## 2016-01-30 DIAGNOSIS — F17210 Nicotine dependence, cigarettes, uncomplicated: Secondary | ICD-10-CM | POA: Diagnosis not present

## 2016-01-30 DIAGNOSIS — Z90710 Acquired absence of both cervix and uterus: Secondary | ICD-10-CM

## 2016-01-30 DIAGNOSIS — R918 Other nonspecific abnormal finding of lung field: Secondary | ICD-10-CM

## 2016-01-30 DIAGNOSIS — M549 Dorsalgia, unspecified: Secondary | ICD-10-CM | POA: Diagnosis not present

## 2016-01-30 LAB — COMPREHENSIVE METABOLIC PANEL
ALK PHOS: 55 U/L (ref 38–126)
ALT: 17 U/L (ref 14–54)
ANION GAP: 7 (ref 5–15)
AST: 20 U/L (ref 15–41)
Albumin: 4.1 g/dL (ref 3.5–5.0)
BILIRUBIN TOTAL: 0.2 mg/dL — AB (ref 0.3–1.2)
BUN: 16 mg/dL (ref 6–20)
CO2: 26 mmol/L (ref 22–32)
Calcium: 9.2 mg/dL (ref 8.9–10.3)
Chloride: 106 mmol/L (ref 101–111)
Creatinine, Ser: 0.97 mg/dL (ref 0.44–1.00)
GLUCOSE: 95 mg/dL (ref 65–99)
Potassium: 3.7 mmol/L (ref 3.5–5.1)
SODIUM: 139 mmol/L (ref 135–145)
Total Protein: 7.5 g/dL (ref 6.5–8.1)

## 2016-01-30 LAB — CBC WITH DIFFERENTIAL/PLATELET
Basophils Absolute: 0.1 10*3/uL (ref 0–0.1)
Basophils Relative: 1 %
EOS ABS: 0.4 10*3/uL (ref 0–0.7)
EOS PCT: 5 %
HCT: 35.2 % (ref 35.0–47.0)
HEMOGLOBIN: 12 g/dL (ref 12.0–16.0)
LYMPHS ABS: 1.3 10*3/uL (ref 1.0–3.6)
Lymphocytes Relative: 16 %
MCH: 29.9 pg (ref 26.0–34.0)
MCHC: 34.1 g/dL (ref 32.0–36.0)
MCV: 87.7 fL (ref 80.0–100.0)
MONO ABS: 0.7 10*3/uL (ref 0.2–0.9)
MONOS PCT: 8 %
Neutro Abs: 5.8 10*3/uL (ref 1.4–6.5)
Neutrophils Relative %: 70 %
Platelets: 368 10*3/uL (ref 150–440)
RBC: 4.01 MIL/uL (ref 3.80–5.20)
RDW: 13.4 % (ref 11.5–14.5)
WBC: 8.3 10*3/uL (ref 3.6–11.0)

## 2016-01-30 LAB — LACTATE DEHYDROGENASE: LDH: 292 U/L — ABNORMAL HIGH (ref 98–192)

## 2016-01-30 LAB — TROPONIN I

## 2016-01-30 MED ORDER — LISINOPRIL 10 MG PO TABS
ORAL_TABLET | ORAL | Status: AC
  Filled 2016-01-30: qty 1

## 2016-01-30 MED ORDER — LISINOPRIL 10 MG PO TABS
10.0000 mg | ORAL_TABLET | Freq: Once | ORAL | Status: AC
  Administered 2016-01-30: 10 mg via ORAL
  Filled 2016-01-30: qty 1

## 2016-01-30 MED ORDER — LISINOPRIL 10 MG PO TABS: 10.0000 mg | ORAL_TABLET | Freq: Every day | ORAL | 0 refills | Status: AC

## 2016-01-30 MED ORDER — OXYCODONE-ACETAMINOPHEN 5-325 MG PO TABS
1.0000 | ORAL_TABLET | Freq: Once | ORAL | Status: AC
  Administered 2016-01-30: 1 via ORAL
  Filled 2016-01-30: qty 1

## 2016-01-30 MED ORDER — OXYCODONE-ACETAMINOPHEN 5-325 MG PO TABS: ORAL_TABLET | ORAL | 0 refills | Status: AC

## 2016-01-30 NOTE — Progress Notes (Signed)
Patient here as new evaluation today regarding renal mass.  Referred by Dr. Candiss Norse. Patient saw Dr. Oliva Bustard when she was 55 y/o for ovarian cancer.  Patient very anxious today.  BP 188/119 Hr 76. Recheck 191/136 HR 72.

## 2016-01-30 NOTE — Discharge Instructions (Signed)
You were evaluated for high blood pressure which I suspect is due to combination of stress from the new diagnosis and pain and medication stimulant caffeine, and possibly the kidney mass itself.  Return to the emergency department for any headache, confusion or altered mental status, weakness or numbness, blurry vision, chest pain, nausea with vomiting, or any other symptoms concerning to you.  I am starting you on a low-dose blood pressure pill called lisinopril. He will need recheck of your blood pressure and laboratory studies for your kidney function in 1-2 weeks.  We discussed some topics to look up online: [email or web address] and his "square one" course Google "tapping" or "eft" or check on youTube for videos about stress reduction techniques

## 2016-01-30 NOTE — ED Notes (Signed)
After sending the blood specimens to the lab. I called and let them know that the patient had came from the cancer center and that the technician there attempted to draw her blood but failed. so I sent an extra light green top and an extra red top in hopes that they could draw what they needed for the cancer center too. The lab said they would look at the orders to see if they could get all of what they needed for everything.

## 2016-01-30 NOTE — ED Provider Notes (Signed)
[facility] Emergency Department Provider Note ____________________________________________   I have reviewed the triage vital signs and the triage nursing note.  HISTORY  Chief Complaint Hypertension   Historian Patient and spouse  HPI Kimberly [name] is a 55 y.o. female with a history of ovarian cancer rate 16, is presenting today after being sent in from her oncologist appointment for elevated blood pressures, asymptomatic but found to be 190s over 130s.She's had no chest pain or palpitations. She states that she has certainly been extremely anxious after recently being diagnosed with stage IV kidney cancer a few weeks ago. She has been taking a migraine headache over the counter medication that has Tylenol and caffeine in it. No prior history of hypertension. No weakness or numbness or strokelike symptoms.    Past Medical History:  Diagnosis Date  . Anxiety 03/18/2015  . Cancer ([place])    Ovarian cancer at age 42  . Exercise-induced asthma 03/18/2015  . Gastroesophageal reflux disease without esophagitis 03/18/2015  . Ovarian cancer ([place])   . Renal insufficiency 01/12/2016    Patient Active Problem List   Diagnosis Date Noted  . Cancer related pain 01/30/2016  . Urothelial cancer ([place]) 01/26/2016  . Renal insufficiency 01/12/2016  . Need for hepatitis C screening test 11/11/2015  . Lipid screening 11/11/2015  . Screening for diabetes mellitus 11/11/2015  . Anxiety 03/18/2015  . Exercise-induced asthma 03/18/2015  . Gastroesophageal reflux disease without esophagitis 03/18/2015    Past Surgical History:  Procedure Laterality Date  . ABDOMINAL HYSTERECTOMY  1979    Prior to Admission medications   Medication Sig Start Date End Date Taking? Authorizing Provider  Biotin 1000 MCG CHEW Chew by mouth.    Historical Provider, MD  citalopram (CELEXA) 10 MG tablet  12/07/15   Historical Provider, MD  diphenhydrAMINE (SOMINEX) 25 MG tablet Take 25 mg by  mouth at bedtime as needed for sleep.    Historical Provider, MD  fentaNYL (DURAGESIC - DOSED MCG/HR) 12 MCG/HR  01/22/16   Historical Provider, MD  lisinopril (PRINIVIL,ZESTRIL) 10 MG tablet Take 1 tablet (10 mg total) by mouth daily. 01/30/16 01/29/17  Lisa Roca, MD  Lubiprostone (AMITIZA PO) Take by mouth.    Historical Provider, MD  montelukast (SINGULAIR) 10 MG tablet TAKE 1 TABLET DAILY 02/24/15   Kathrine Haddock, NP  oxyCODONE-acetaminophen (PERCOCET/ROXICET) 5-325 MG tablet 1 tablet every 4-6 hours AS NEEDED FOR PAIN 01/30/16   Lequita Asal, MD  pantoprazole (PROTONIX) 40 MG tablet  01/12/16   Historical Provider, MD  PROVENTIL HFA 108 (90 Base) MCG/ACT inhaler USE 2 INHALATIONS FOUR TIMES A DAY AS NEEDED 11/02/15   Guadalupe Maple, MD    No Known Allergies  Family History  Problem Relation Age of Onset  . Prostate cancer Neg Hx   . Bladder Cancer Neg Hx   . Kidney cancer Neg Hx     Social History Social History  Substance Use Topics  . Smoking status: Current Every Day Smoker    Packs/day: 0.25    Types: Cigarettes  . Smokeless tobacco: Never Used  . Alcohol use Yes     Comment: occasional    Review of Systems  Constitutional: Negative for fever. Eyes: Negative for visual changes. ENT: Negative for sore throat. Cardiovascular: Negative for chest pain. Respiratory: Negative for shortness of breath. Gastrointestinal: She has been having back pain, recently was added Percocet prescription today by oncologist, and she has a pain patch. Genitourinary: Negative for dysuria. Musculoskeletal: Positive  for back pain. Skin: Negative for rash. Neurological: Negative for headache. 10 point Review of Systems otherwise negative ____________________________________________   PHYSICAL EXAM:  VITAL SIGNS: ED Triage Vitals  Enc Vitals Group     BP 01/30/16 1237 (!) 199/105     Pulse Rate 01/30/16 1237 81     Resp 01/30/16 1237 18     Temp 01/30/16 1237 98 F (36.7 C)      Temp Source 01/30/16 1237 Oral     SpO2 01/30/16 1237 98 %     Weight 01/30/16 1237 138 lb (62.6 kg)     Height 01/30/16 1237 5\' 5"  (1.651 m)     Head Circumference --      Peak Flow --      Pain Score 01/30/16 1530 7     Pain Loc --      Pain Edu? --      Excl. in [place]? --      Constitutional: Alert and oriented. Well appearing and in no distress. She does appear anxious. HEENT   Head: Normocephalic and atraumatic.      Eyes: Conjunctivae are normal. PERRL. Normal extraocular movements.      Ears:         Nose: No congestion/rhinnorhea.   Mouth/Throat: Mucous membranes are moist.   Neck: No stridor. Cardiovascular/Chest: Normal rate, regular rhythm.  No murmurs, rubs, or gallops. Respiratory: Normal respiratory effort without tachypnea nor retractions. Breath sounds are clear and equal bilaterally. No wheezes/rales/rhonchi. Gastrointestinal: Soft. No distention, no guarding, no rebound. Nontender.   Genitourinary/rectal:Deferred Musculoskeletal: Nontender with normal range of motion in all extremities. No joint effusions.  No lower extremity tenderness.  No edema. Neurologic:  Normal speech and language. No gross or focal neurologic deficits are appreciated. Skin:  Skin is warm, dry and intact. No rash noted. Psychiatric: Mood and affect are normal. Speech and behavior are normal. Patient exhibits appropriate insight and judgment.   ____________________________________________  LABS (pertinent positives/negatives)  Labs Reviewed  COMPREHENSIVE METABOLIC PANEL - Abnormal; Notable for the following:       Result Value   Total Bilirubin 0.2 (*)    All other components within normal limits  LACTATE DEHYDROGENASE - Abnormal; Notable for the following:    LDH 292 (*)    All other components within normal limits  CBC WITH DIFFERENTIAL/PLATELET  TROPONIN I    ____________________________________________    EKG I, Lisa Roca, MD, the attending physician have  personally viewed and interpreted all ECGs.  74 bpm. Normal sinus rhythm. Narrow distress. No axis. Normal ST and T-wave ____________________________________________  RADIOLOGY All Xrays were viewed by me. Imaging interpreted by Radiologist.  None __________________________________________  PROCEDURES  Procedure(s) performed: None  Critical Care performed: None  ____________________________________________   ED COURSE / ASSESSMENT AND PLAN  Pertinent labs & imaging results that were available during my care of the patient were reviewed by me and considered in my medical decision making (see chart for details).   Ms. Verzosa is here after being pulled by her oncologist to come in for asymptomatic hypertension. It looks like her previous oncology visits over the past month she has had elevated blood pressures in the 170s over 100s range. But prior to a month ago she did have normal blood pressures. I think her elevated blood pressure is multifactorial, she admits to taking a supplement that has caffeine in it, and she is certainly extremely anxious about the new stage IV cancer diagnosis, and additionally she is having still  uncontrolled pain.  I spoke with the nephrologist on call Dr. 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  She was given Percocet for pain control here. We discussed stress reduction as a important component moving forward. She should follow up with primary care doctor in about a week for recheck of her blood pressure and renal function due to the new prescription medications lisinopril.   CONSULTATIONS:   Dr. Marita Snellen, nephrology by phone discussion.   Patient / Family / Caregiver informed of clinical course, medical decision-making process, and agree with plan.   I discussed return precautions, follow-up instructions, and discharge instructions with patient and/or  family.   ___________________________________________   FINAL CLINICAL IMPRESSION(S) / ED DIAGNOSES   Final diagnoses:  Secondary hypertension, unspecified              Note: This dictation was prepared with Dragon dictation. Any transcriptional errors that result from this process are unintentional    Lisa Roca, MD 01/30/16 1907

## 2016-01-30 NOTE — ED Triage Notes (Signed)
Pt diagnosed today with renal cancer, was being seen at cancer center and sent here for htn. Pt appears anxious, bp 199/105.

## 2016-01-30 NOTE — Progress Notes (Signed)
[facility] day:  01/30/2016  Chief Complaint: Kimberly [name] is a 55 y.o. female with right renal mass who is referred by Dr. Glendon Axe in consultation for assessment and management.  HPI: The patient has a past medical history significant for ovarian cancer as a teenager (age 30). She underwent hysterectomy and bilateral oophorectomy.  She did not receive chemotherapy or radiation.  Further details are unknown. She was previously followed by Dr. Oliva Bustard.   No records are available.  She presented with a 4 month history of fatigue and right sided flank pain.  She initially felt that her discomfort was from working 12 hours/day.  She managed her pain with OTC combination pill (Tylenol, caffeine, aspirin).  She was seen on 01/21/2016 by Paulita Cradle, PA for flank and low back pain.  Urinalysis revealed large blood.  Creatinine was 1.0.  CT stone study was ordered.  Abdominal and pelvic CT scan without contrast on 01/21/2016 revealed multiple bilateral pulmonary nodules. There was a heterogeneous right kidney with stranding of surrounding fat. There was high density material in the right renal pelvis and proximal right ureter.  Chest, abdominal and pelvic CT scan with contrast on 01/22/2016 revealed 2.4 x 1.8 cm left hilar adenopathy, multiple lung nodules 3 mm to 1.9 x 1.4 cm), lytic lesion left humeral head, subtle lytic lesions in the thoracic spine, 1.1 x 1.1 cm enhancing lesion in the dome of the liver, diffuse abnormal enhancement of the right kidney, irregular thickening of the right renal vein extending into the IVC c/w tumor thrombus, and a 1.2 cm lymph node between the aorta and IVC.  She was seen by Dr. Hollice Espy on 01/23/2016.  A CT guided lung biopsy was scheduled. CT guided right lower lobe lung biopsy on 01/26/2016 confirmed metastatic urothelial carcinoma.  The carcinoma was positive for GATA3 and p40.  It was negative for  TTF-1.  Symptomatically, she notes fatigue and ongoing pain.  She was initially prescribed a Fentanyl 12 mcg/hr patch.  She is currently on Fentanyl 25 mcg/hr.  She has intermittent pain depending on her activity.   Past Medical History:  Diagnosis Date  . Anxiety 03/18/2015  . Cancer ([place])    Ovarian cancer at age 3  . Exercise-induced asthma 03/18/2015  . Gastroesophageal reflux disease without esophagitis 03/18/2015  . Ovarian cancer ([place])   . Renal insufficiency 01/12/2016    Past Surgical History:  Procedure Laterality Date  . ABDOMINAL HYSTERECTOMY  1979    Family History  Problem Relation Age of Onset  . Cancer - Other Mother     retroperitoneal mass  . Multiple myeloma Father   . Prostate cancer Neg Hx   . Bladder Cancer Neg Hx   . Kidney cancer Neg Hx     Social History:  reports that she has been smoking Cigarettes.  She has been smoking about 0.25 packs per day. She has never used smokeless tobacco. She reports that she drinks alcohol. She reports that she does not use drugs.  The patient is accompanied byher husband and mother today.  Allergies: No Known Allergies  Current Medications: Current Outpatient Prescriptions  Medication Sig Dispense Refill  . Biotin 1000 MCG CHEW Chew by mouth.    . citalopram (CELEXA) 10 MG tablet     . diphenhydrAMINE (SOMINEX) 25 MG tablet Take 25 mg by mouth at bedtime as needed for sleep.    . fentaNYL (DURAGESIC - DOSED MCG/HR) 12 MCG/HR     .  Lubiprostone (AMITIZA PO) Take by mouth.    . montelukast (SINGULAIR) 10 MG tablet TAKE 1 TABLET DAILY 90 tablet 2  . pantoprazole (PROTONIX) 40 MG tablet     . PROVENTIL HFA 108 (90 Base) MCG/ACT inhaler USE 2 INHALATIONS FOUR TIMES A DAY AS NEEDED 20.1 g 0  . lisinopril (PRINIVIL,ZESTRIL) 10 MG tablet Take 1 tablet (10 mg total) by mouth daily. 30 tablet 0  . oxyCODONE-acetaminophen (PERCOCET/ROXICET) 5-325 MG tablet 1 tablet every 4-6 hours AS NEEDED FOR PAIN 40 tablet 0   No current  facility-administered medications for this visit.     Review of Systems:  GENERAL:  Fatigue.  No fevers, sweats or weight loss. PERFORMANCE STATUS (ECOG):  1 HEENT:  No visual changes, runny nose, sore throat, mouth sores or tenderness. Lungs: No shortness of breath or cough.  No hemoptysis. Cardiac:  No chest pain, palpitations, orthopnea, or PND. GI:  No nausea, vomiting, diarrhea, constipation, melena or hematochezia. GU:  Right flank pain.  No urgency, frequency, dysuria, or hematuria. Musculoskeletal:  Low back ache.  No joint pain.  No muscle tenderness. Extremities:  No pain or swelling. Skin:  No rashes or skin changes. Neuro:  No headache, numbness or weakness, balance or coordination issues. Endocrine:  No diabetes, thyroid issues, hot flashes or night sweats. Psych:  No mood changes, depression or anxiety. Pain:  No focal pain. Review of systems:  All other systems reviewed and found to be negative.  Physical Exam: Blood pressure (!) 174/113, pulse 84, temperature 98.5 F (36.9 C), temperature source Tympanic, resp. rate 18, height 5' 5"  (1.651 m), weight 144 lb 6.4 oz (65.5 kg). GENERAL:  Well developed, well nourished, woman sitting comfortably in the exam room in no acute distress. MENTAL STATUS:  Alert and oriented to person, place and time. HEAD:  Short styled highlighted hair.  Normocephalic, atraumatic, face symmetric, no Cushingoid features. EYES:  Brown eyes.  Pupils equal round and reactive to light and accomodation.  No conjunctivitis or scleral icterus. ENT:  Oropharynx clear without lesion.  Tongue normal. Mucous membranes moist.  RESPIRATORY:  Clear to auscultation without rales, wheezes or rhonchi. CARDIOVASCULAR:  Regular rate and rhythm without murmur, rub or gallop. ABDOMEN:  Soft, non-tender, with active bowel sounds, and no hepatosplenomegaly.  No masses. SKIN:  No rashes, ulcers or lesions. EXTREMITIES: No edema, no skin discoloration or tenderness.  No  palpable cords. LYMPH NODES: No palpable cervical, supraclavicular, axillary or inguinal adenopathy  NEUROLOGICAL: Unremarkable. PSYCH:  Appropriate.   Admission on 01/30/2016, Discharged on 01/30/2016  Component Date Value Ref Range Status  . WBC 01/30/2016 8.3  3.6 - 11.0 K/uL Final  . RBC 01/30/2016 4.01  3.80 - 5.20 MIL/uL Final  . Hemoglobin 01/30/2016 12.0  12.0 - 16.0 g/dL Final  . HCT 01/30/2016 35.2  35.0 - 47.0 % Final  . MCV 01/30/2016 87.7  80.0 - 100.0 fL Final  . MCH 01/30/2016 29.9  26.0 - 34.0 pg Final  . MCHC 01/30/2016 34.1  32.0 - 36.0 g/dL Final  . RDW 01/30/2016 13.4  11.5 - 14.5 % Final  . Platelets 01/30/2016 368  150 - 440 K/uL Final  . Neutrophils Relative % 01/30/2016 70  % Final  . Neutro Abs 01/30/2016 5.8  1.4 - 6.5 K/uL Final  . Lymphocytes Relative 01/30/2016 16  % Final  . Lymphs Abs 01/30/2016 1.3  1.0 - 3.6 K/uL Final  . Monocytes Relative 01/30/2016 8  % Final  . Monocytes Absolute  01/30/2016 0.7  0.2 - 0.9 K/uL Final  . Eosinophils Relative 01/30/2016 5  % Final  . Eosinophils Absolute 01/30/2016 0.4  0 - 0.7 K/uL Final  . Basophils Relative 01/30/2016 1  % Final  . Basophils Absolute 01/30/2016 0.1  0 - 0.1 K/uL Final  . Sodium 01/30/2016 139  135 - 145 mmol/L Final  . Potassium 01/30/2016 3.7  3.5 - 5.1 mmol/L Final  . Chloride 01/30/2016 106  101 - 111 mmol/L Final  . CO2 01/30/2016 26  22 - 32 mmol/L Final  . Glucose, Bld 01/30/2016 95  65 - 99 mg/dL Final  . BUN 01/30/2016 16  6 - 20 mg/dL Final  . Creatinine, Ser 01/30/2016 0.97  0.44 - 1.00 mg/dL Final  . Calcium 01/30/2016 9.2  8.9 - 10.3 mg/dL Final  . Total Protein 01/30/2016 7.5  6.5 - 8.1 g/dL Final  . Albumin 01/30/2016 4.1  3.5 - 5.0 g/dL Final  . AST 01/30/2016 20  15 - 41 U/L Final  . ALT 01/30/2016 17  14 - 54 U/L Final  . Alkaline Phosphatase 01/30/2016 55  38 - 126 U/L Final  . Total Bilirubin 01/30/2016 0.2* 0.3 - 1.2 mg/dL Final  . GFR calc non Af Amer 01/30/2016 >60   >60 mL/min Final  . GFR calc Af Amer 01/30/2016 >60  >60 mL/min Final   Comment: (NOTE) The eGFR has been calculated using the CKD EPI equation. This calculation has not been validated in all clinical situations. eGFR's persistently <60 mL/min signify possible Chronic Kidney Disease.   . Anion gap 01/30/2016 7  5 - 15 Final  . LDH 01/30/2016 292* 98 - 192 U/L Final  . Troponin I 01/30/2016 <0.03  <0.03 ng/mL Final    Assessment:  AIRIANNA KREISCHER is a 55 y.o. 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  CT guided right lower lobe lung biopsy on 01/26/2016 confirmed metastatic urothelial carcinoma.  Tumor was positive for GATA3 and p40.  Tumor was negative for TTF-1.  Chest, abdominal and pelvic CT scan with contrast on 01/22/2016 revealed 2.4 x 1.8 cm left hilar adenopathy, multiple lung nodules 3 mm to 1.9 x 1.4 cm), lytic lesion left humeral head, subtle lytic lesions in the thoracic spine, 1.1 x 1.1 cm enhancing lesion in the dome of the liver, diffuse abnormal enhancement of the right kidney, irregular thickening of the right renal vein extending into the IVC c/w tumor thrombus, and a 1.2 cm lymph node between the aorta and IVC.  Symptomatically, she notes fatigue and pain.  She is currently on Fentanyl 25 mcg/hr.  She has intermittent pain depending on her activity.  She has severe hypertension.  Plan: 1.  Discuss results of CT guided biopsy. Discuss diagnosis of metastatic urothelial carcinoma.  Discuss prognosis and treatment.  Discuss palliative treatment with cisplatin based chemotherapy.  Discuss gemcitabine and cisplatin (response rates 45-50%, TTP 7 months, overall survival 14-16 months).  Side effects and administration reviewed in detail.  Discuss checkpoint inhibitor immunotherapy at progression.  Discuss consideration of clinic trial ([facility], [facility], or other regional cancer center) with standard cisplatin based chemotherapy +/- checkpoint  inhibitor.  Patient interested in clinical trial.  2.  Discuss pain management.  Patient to keep a pain diary.  Discuss Percocet for breakthrough pain. 3.  Labs today:  CBC with diff, CMP, LDH. 4.  Rx:  Percocet 3/325 1 tablet po q 4-6 hours prn pain. Dis: # 40. 5.  Contact  clinical trials regarding eligibility (local/regional). 6.  Patient to ER to manage hypertension (BP 188/119 with repeat 191/136). 7.  RTC in 1 week for MD assessment and discussion regarding direction of therapy.  Addendum:  Spoke with clinical trials at [facility].  No local trials, but several trials in [facility] and [facility]).  Message left at the [facility] [phone]).  [place] [facility] clinic trials [phone]).  Patient appears to be a candidate for [id number] (Study of atezolizumab as monotherapy and in combination with platinum-based chemotherapy in participatants with untreated locally advanced or metastatic urothelial carcinoma.  [id number]- Cisplatin and gemcitabine with or without ATK kinase inhibitor VX-970 in patients with metastatic urothelial cancer.   Lequita Asal, MD  01/30/2016, 11:09 AM

## 2016-01-30 NOTE — Telephone Encounter (Signed)
Patient sent to [facility] lab to have labs prior to being transferrred to ED regarding elevated BP.  Lab unable to obtain lab draw.  MD notified.  Patient transported to ED by Rodena Piety, RN, via wheelchair, without incident.  Dr. Mike Gip previously talked to ED MD regarding patient.

## 2016-01-31 ENCOUNTER — Other Ambulatory Visit: Payer: Self-pay | Admitting: Family Medicine

## 2016-01-31 ENCOUNTER — Encounter: Payer: Self-pay | Admitting: Hematology and Oncology

## 2016-02-02 ENCOUNTER — Other Ambulatory Visit: Payer: Self-pay | Admitting: Urology

## 2016-02-05 ENCOUNTER — Inpatient Hospital Stay: Payer: BLUE CROSS/BLUE SHIELD | Admitting: Hematology and Oncology

## 2016-02-05 NOTE — Progress Notes (Deleted)
Kimberly [name]:  [name]   Chief Complaint: REMEE [name] is a 55 y.o. female with metastatic urothelial carcinoma who is seen for reassessment.  HPI: The patient was last seen in the medical oncology clinic on 01/30/2016 for initial assessment.  We discussed treatment of metastatic urothelial carcinoma with cisplatin and gemcitabine.  We discussed potential clinical trial enrollment at [facility].  Right sided flank pain was modestly well controlled with Fentanyl 25 mcg/hr ptach.  She was prescribed Percocet 5/325 prn breakthough pain.  Because of significant hypertension, she was seen in the ER.  She was prescribed lisinopril.    Past Medical History:  Diagnosis Date  . Anxiety 03/18/2015  . Cancer ([place])    Ovarian cancer at age 16  . Exercise-induced asthma 03/18/2015  . Gastroesophageal reflux disease without esophagitis 03/18/2015  . Ovarian cancer ([place])   . Renal insufficiency 01/12/2016    Past Surgical History:  Procedure Laterality Date  . ABDOMINAL HYSTERECTOMY  1979    Family History  Problem Relation Age of Onset  . Cancer - Other Mother     retroperitoneal mass  . Multiple myeloma Father   . Prostate cancer Neg Hx   . Bladder Cancer Neg Hx   . Kidney cancer Neg Hx     Social History:  reports that she has been smoking Cigarettes.  She has been smoking about 0.25 packs per [name]. She has never used smokeless tobacco. She reports that she drinks alcohol. She reports that she does not use drugs.  The patient is accompanied byher husband and mother today.  Allergies: No Known Allergies  Current Medications: Current Outpatient Prescriptions  Medication Sig Dispense Refill  . Biotin 1000 MCG CHEW Chew by mouth.    . citalopram (CELEXA) 10 MG tablet     . diphenhydrAMINE (SOMINEX) 25 MG tablet Take 25 mg by mouth at bedtime as needed for sleep.    . fentaNYL (DURAGESIC - DOSED MCG/HR) 12 MCG/HR     . lisinopril  (PRINIVIL,ZESTRIL) 10 MG tablet Take 1 tablet (10 mg total) by mouth daily. 30 tablet 0  . Lubiprostone (AMITIZA PO) Take by mouth.    . montelukast (SINGULAIR) 10 MG tablet TAKE 1 TABLET DAILY 90 tablet 2  . oxyCODONE-acetaminophen (PERCOCET/ROXICET) 5-325 MG tablet 1 tablet every 4-6 hours AS NEEDED FOR PAIN 40 tablet 0  . pantoprazole (PROTONIX) 40 MG tablet     . PROVENTIL HFA 108 (90 Base) MCG/ACT inhaler USE 2 INHALATIONS FOUR TIMES A [name] AS NEEDED 20.1 g 0   No current facility-administered medications for this visit.     Review of Systems:  GENERAL:  Fatigue.  No fevers, sweats or weight loss. PERFORMANCE STATUS (ECOG):  1 HEENT:  No visual changes, runny nose, sore throat, mouth sores or tenderness. Lungs: No shortness of breath or cough.  No hemoptysis. Cardiac:  No chest pain, palpitations, orthopnea, or PND. GI:  No nausea, vomiting, diarrhea, constipation, melena or hematochezia. GU:  Right flank pain.  No urgency, frequency, dysuria, or hematuria. Musculoskeletal:  Low back ache.  No joint pain.  No muscle tenderness. Extremities:  No pain or swelling. Skin:  No rashes or skin changes. Neuro:  No headache, numbness or weakness, balance or coordination issues. Endocrine:  No diabetes, thyroid issues, hot flashes or night sweats. Psych:  No mood changes, depression or anxiety. Pain:  No focal pain. Review of systems:  All other systems reviewed and found  to be negative.  Physical Exam: There were no vitals taken for this visit. GENERAL:  Well developed, well nourished, woman sitting comfortably in the exam room in no acute distress. MENTAL STATUS:  Alert and oriented to person, place and time. HEAD:  Short styled highlighted hair.  Normocephalic, atraumatic, face symmetric, no Cushingoid features. EYES:  Brown eyes.  Pupils equal round and reactive to light and accomodation.  No conjunctivitis or scleral icterus. ENT:  Oropharynx clear without lesion.  Tongue normal.  Mucous membranes moist.  RESPIRATORY:  Clear to auscultation without rales, wheezes or rhonchi. CARDIOVASCULAR:  Regular rate and rhythm without murmur, rub or gallop. ABDOMEN:  Soft, non-tender, with active bowel sounds, and no hepatosplenomegaly.  No masses. SKIN:  No rashes, ulcers or lesions. EXTREMITIES: No edema, no skin discoloration or tenderness.  No palpable cords. LYMPH NODES: No palpable cervical, supraclavicular, axillary or inguinal adenopathy  NEUROLOGICAL: Unremarkable. PSYCH:  Appropriate.   No visits with results within 3 [name](s) from this visit.  Latest known visit with results is:  Admission on 01/30/2016, Discharged on 01/30/2016  Component Date Value Ref Range Status  . WBC 01/30/2016 8.3  3.6 - 11.0 K/uL Final  . RBC 01/30/2016 4.01  3.80 - 5.20 MIL/uL Final  . Hemoglobin 01/30/2016 12.0  12.0 - 16.0 g/dL Final  . HCT 01/30/2016 35.2  35.0 - 47.0 % Final  . MCV 01/30/2016 87.7  80.0 - 100.0 fL Final  . MCH 01/30/2016 29.9  26.0 - 34.0 pg Final  . MCHC 01/30/2016 34.1  32.0 - 36.0 g/dL Final  . RDW 01/30/2016 13.4  11.5 - 14.5 % Final  . Platelets 01/30/2016 368  150 - 440 K/uL Final  . Neutrophils Relative % 01/30/2016 70  % Final  . Neutro Abs 01/30/2016 5.8  1.4 - 6.5 K/uL Final  . Lymphocytes Relative 01/30/2016 16  % Final  . Lymphs Abs 01/30/2016 1.3  1.0 - 3.6 K/uL Final  . Monocytes Relative 01/30/2016 8  % Final  . Monocytes Absolute 01/30/2016 0.7  0.2 - 0.9 K/uL Final  . Eosinophils Relative 01/30/2016 5  % Final  . Eosinophils Absolute 01/30/2016 0.4  0 - 0.7 K/uL Final  . Basophils Relative 01/30/2016 1  % Final  . Basophils Absolute 01/30/2016 0.1  0 - 0.1 K/uL Final  . Sodium 01/30/2016 139  135 - 145 mmol/L Final  . Potassium 01/30/2016 3.7  3.5 - 5.1 mmol/L Final  . Chloride 01/30/2016 106  101 - 111 mmol/L Final  . CO2 01/30/2016 26  22 - 32 mmol/L Final  . Glucose, Bld 01/30/2016 95  65 - 99 mg/dL Final  . BUN 01/30/2016 16  6 - 20 mg/dL  Final  . Creatinine, Ser 01/30/2016 0.97  0.44 - 1.00 mg/dL Final  . Calcium 01/30/2016 9.2  8.9 - 10.3 mg/dL Final  . Total Protein 01/30/2016 7.5  6.5 - 8.1 g/dL Final  . Albumin 01/30/2016 4.1  3.5 - 5.0 g/dL Final  . AST 01/30/2016 20  15 - 41 U/L Final  . ALT 01/30/2016 17  14 - 54 U/L Final  . Alkaline Phosphatase 01/30/2016 55  38 - 126 U/L Final  . Total Bilirubin 01/30/2016 0.2* 0.3 - 1.2 mg/dL Final  . GFR calc non Af Amer 01/30/2016 >60  >60 mL/min Final  . GFR calc Af Amer 01/30/2016 >60  >60 mL/min Final   Comment: (NOTE) The eGFR has been calculated using the CKD EPI equation. This calculation has not  been validated in all clinical situations. eGFR's persistently <60 mL/min signify possible Chronic Kidney Disease.   . Anion gap 01/30/2016 7  5 - 15 Final  . LDH 01/30/2016 292* 98 - 192 U/L Final  . Troponin I 01/30/2016 <0.03  <0.03 ng/mL Final    Assessment:  Kimberly [name] is a 55 y.o. 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  CT guided right lower lobe lung biopsy on 01/26/2016 confirmed metastatic urothelial carcinoma.  Tumor was positive for GATA3 and p40.  Tumor was negative for TTF-1.  Chest, abdominal and pelvic CT scan with contrast on 01/22/2016 revealed 2.4 x 1.8 cm left hilar adenopathy, multiple lung nodules 3 mm to 1.9 x 1.4 cm), lytic lesion left humeral head, subtle lytic lesions in the thoracic spine, 1.1 x 1.1 cm enhancing lesion in the dome of the liver, diffuse abnormal enhancement of the right kidney, irregular thickening of the right renal vein extending into the IVC c/w tumor thrombus, and a 1.2 cm lymph node between the aorta and IVC.  Symptomatically, she notes fatigue and pain.  She is currently on Fentanyl 25 mcg/hr.  She has intermittent pain depending on her activity.  She has severe hypertension.  Plan: 1.  Discuss results of CT guided biopsy. Discuss diagnosis of metastatic urothelial carcinoma.   Discuss prognosis and treatment.  Discuss palliative treatment with cisplatin based chemotherapy.  Discuss gemcitabine and cisplatin (response rates 45-50%, TTP 7 months, overall survival 14-16 months).  Side effects and administration reviewed in detail.  Discuss checkpoint inhibitor immunotherapy at progression.  Discuss consideration of clinic trial ([facility], [facility], or other regional cancer center) with standard cisplatin based chemotherapy +/- checkpoint inhibitor.  Patient interested in clinical trial.  2.  Discuss pain management.  Patient to keep a pain diary.  Discuss Percocet for breakthrough pain. 3.  Labs today:  CBC with diff, CMP, LDH. 4.  Rx:  Percocet 3/325 1 tablet po q 4-6 hours prn pain. Dis: # 40. 5.  Contact clinical trials regarding eligibility (local/regional). 6.  Patient to ER to manage hypertension (BP 188/119 with repeat 191/136). 7.  RTC in 1 week for MD assessment and discussion regarding direction of therapy.  Addendum:  Spoke with clinical trials at [facility].  No local trials, but several trials in [place] [facility] and [facility]).  Message left at the [facility] [phone]).  [place] clinic trials [phone]).  Patient appears to be a candidate for [id number] (Study of atezolizumab as monotherapy and in combination with platinum-based chemotherapy in participatants with untreated locally advanced or metastatic urothelial carcinoma.  [id number]- Cisplatin and gemcitabine with or without ATK kinase inhibitor VX-[id number] in patients with metastatic urothelial cancer.   Lequita Asal, MD  [name], 3:27 AM

## 2016-05-02 ENCOUNTER — Other Ambulatory Visit: Payer: Self-pay | Admitting: Family Medicine

## 2016-07-31 ENCOUNTER — Other Ambulatory Visit: Payer: Self-pay | Admitting: Family Medicine

## 2016-10-30 ENCOUNTER — Other Ambulatory Visit: Payer: Self-pay | Admitting: Family Medicine

## 2017-01-29 ENCOUNTER — Other Ambulatory Visit: Payer: Self-pay | Admitting: Family Medicine

## 2017-03-17 DEATH — deceased

## 2017-08-19 IMAGING — CT CT ABD-PELV W/ CM
2 of 5 series · 11 of 36 positions shown, 13 images · IV contrast (iopamidol)
Comparison: CT abdomen and pelvis January 21, 2016 and November 15, 2012

CLINICAL DATA: Pulmonary nodules; right flank pain. History of
ovarian carcinoma

EXAM:
CT CHEST, ABDOMEN, AND PELVIS WITH CONTRAST
TECHNIQUE: Multidetector CT imaging of the chest, abdomen and pelvis was
performed following the standard protocol during bolus
administration of intravenous contrast. Oral contrast was
administered for the CT abdomen and pelvis study.
CONTRAST:  100mL [id number] IOPAMIDOL ([id number]) INJECTION 61%

[Series 2: cap with · axial · 0.70mm/px · z∈[-1210,-715]mm · 8 of 125 slices shown, 10 images]
[im 13/125  mediastinal]
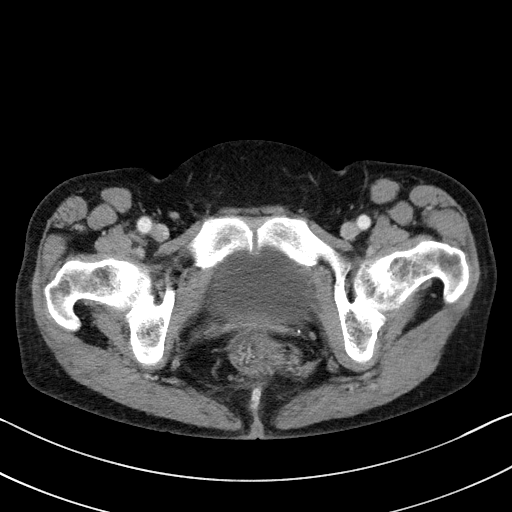
[im 13/125  lung]
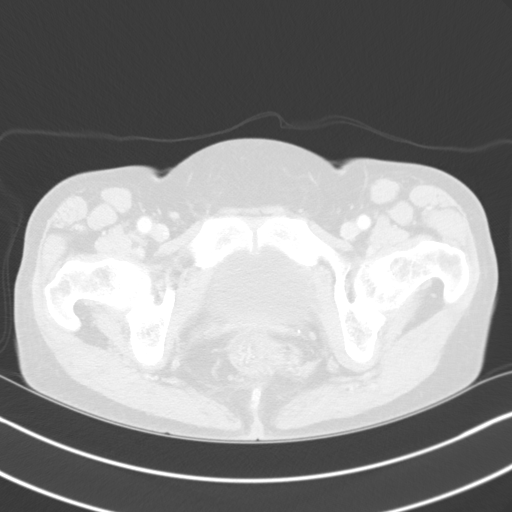
[im 25/125  lung]
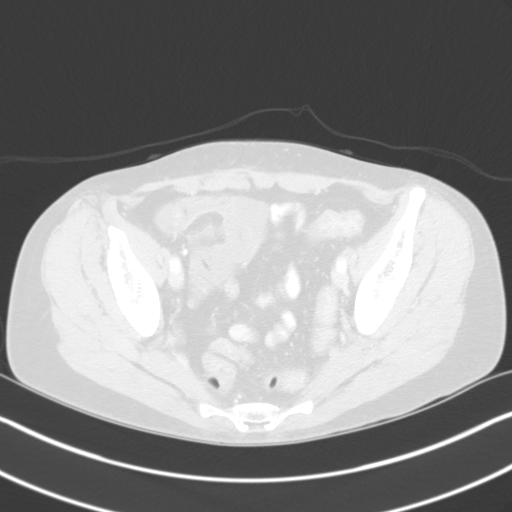
[im 38/125  lung]
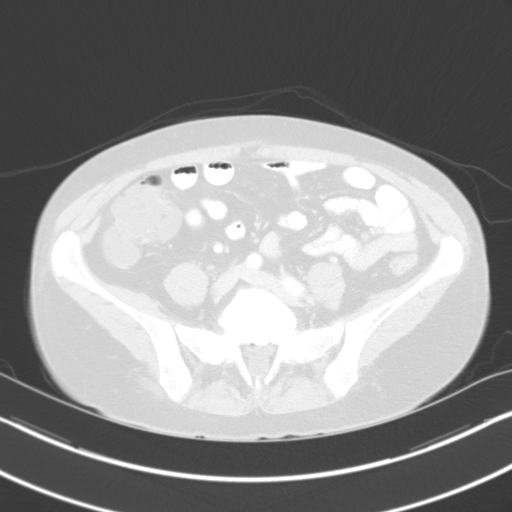
[im 50/125  lung]
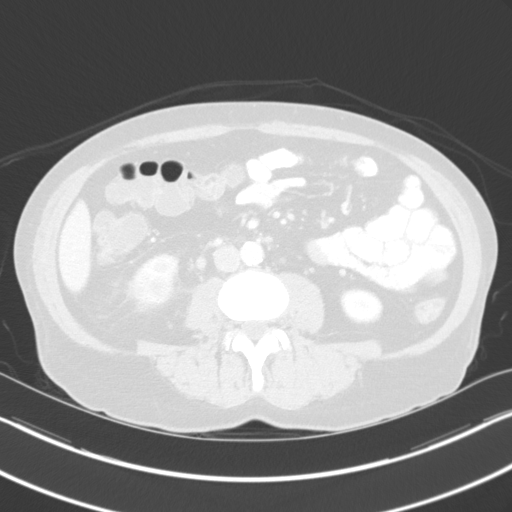
[im 75/125  mediastinal]
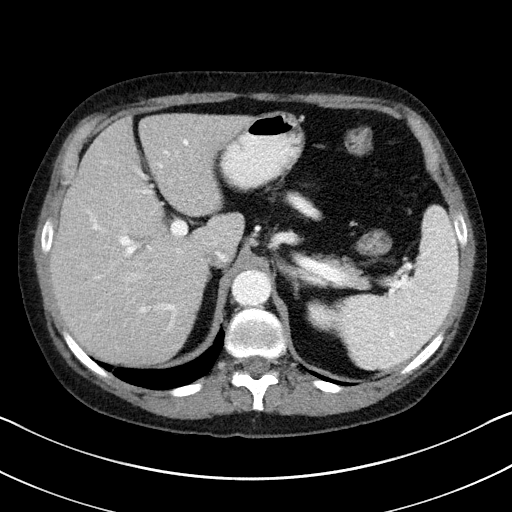
[im 75/125  lung]
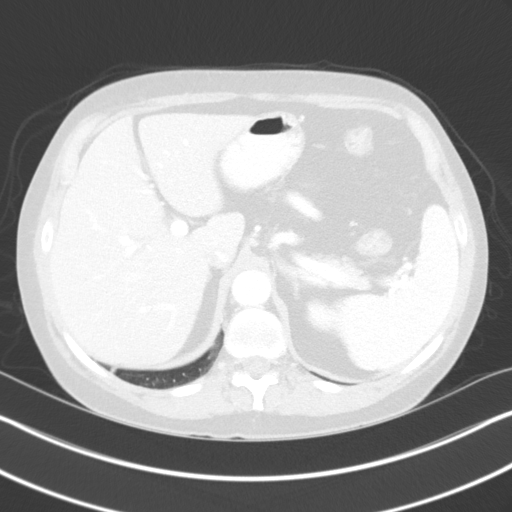
[im 87/125  lung]
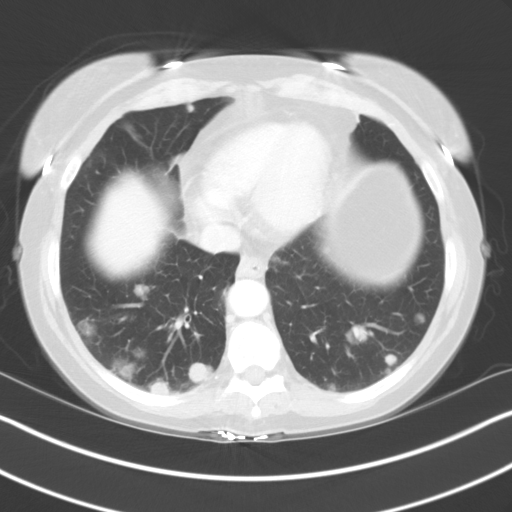
[im 100/125  lung]
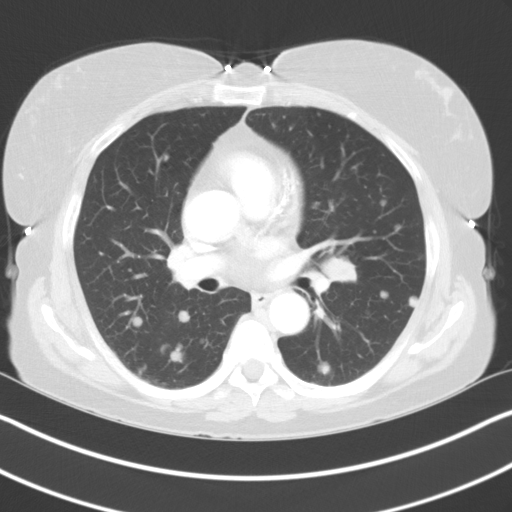
[im 112/125  lung]
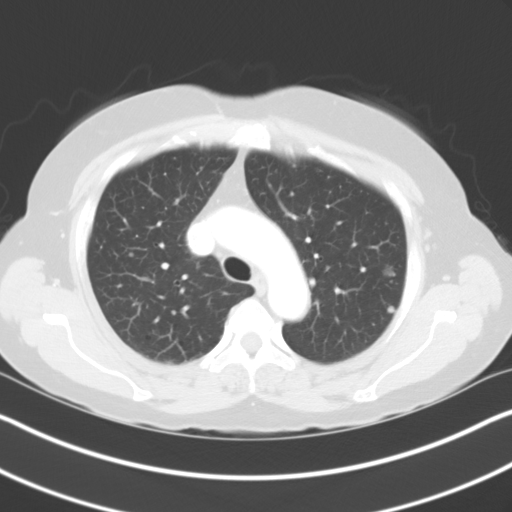

[Series 5: coronals · coronal · 0.80mm/px · 3 of 130 slices shown]
[im 26/130  lung]
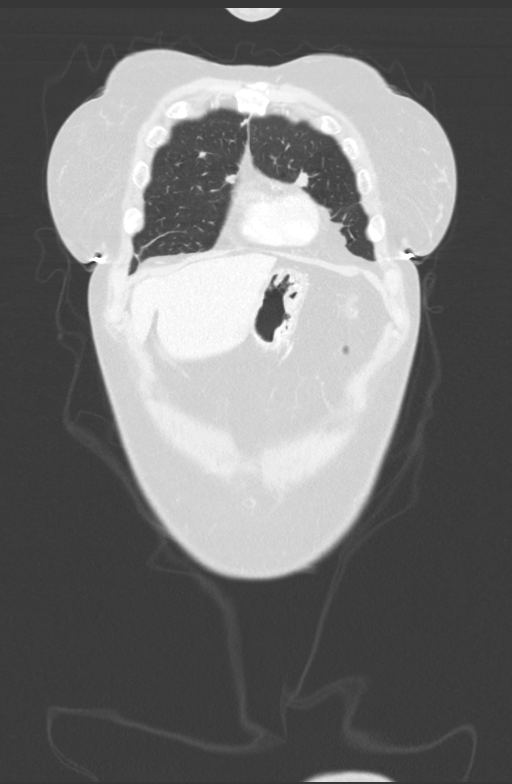
[im 52/130  lung]
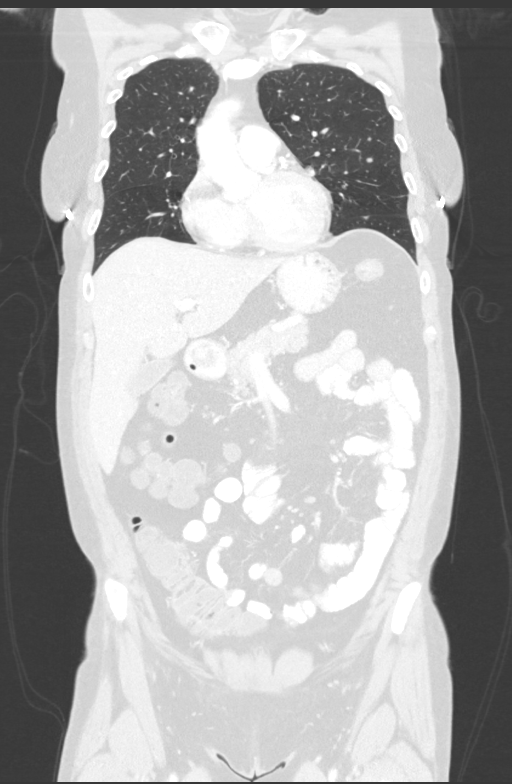
[im 78/130  lung]
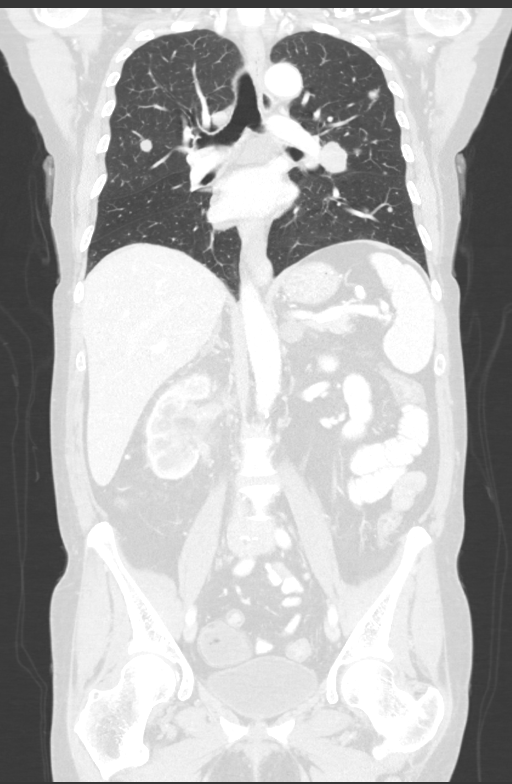

[11 of 36 positions shown; findings below may reference images not displayed]

FINDINGS: CT CHEST FINDINGS

Cardiovascular: There is no appreciable thoracic aortic aneurysm or
dissection. The visualized great vessels appear unremarkable. The
pericardium is not thickened. There are multiple foci of coronary
artery calcification.

Mediastinum/Nodes: The visualized thyroid appears normal. There is
left hilar adenopathy measuring 2.4 x 1.8 cm. There is a cystic
structure in the sub- carinal region measuring 2.9 x 3.1 cm,
possibly a duplication type cyst. There is a small hiatal hernia.

Lungs/Pleura: There are nodular lesions throughout the lungs
bilaterally. Nodular lesions range in size from as small as 3 mm to
as large as 1.9 x 1.4 cm. These lesions are seen throughout the
lungs bilaterally involving most lobes and segments. There is no
edema or consolidation.

Musculoskeletal: There is a lytic lesion in the left humeral head.
Cystic areas in each scapula most likely are of arthropathic
etiology is a post to metastatic foci. There are multiple subtle
lytic lesions throughout the thoracic spine. There is a hemangioma
occupying most of the T5 vertebral body. There is a more subtle
hemangioma in the T3 vertebral body. There is a smaller hemangioma
in the leftward aspect of the T11 vertebral body.

CT ABDOMEN PELVIS FINDINGS

Hepatobiliary: Liver measures 19.8 cm in length. There is a focal
enhancing lesion in the dome of the liver measuring 1.1 x 1.1 cm.
There is an area of decreased attenuation near the dome the liver
posteriorly on the right measuring 1.3 x 0.9 cm. No other focal
liver lesions are identified. Gallbladder wall is not appreciably
thickened. There is no biliary duct dilatation.

Pancreas: No pancreatic mass or inflammatory focus is evident.

Spleen: No splenic lesion evident.

Adrenals/Urinary Tract: There is a 1.4 x 1.4 cm mass in the left
adrenal, stable from 6128 and likely an adenoma. No right adrenal
lesion evident. The left kidney contains a 7 mm cyst in the upper
pole region. There is a 1.3 x 1.0 cm cyst along the periphery of the
mid left kidney. There is a 1.6 x 1.2 cm lower pole left renal cyst.
There is no appreciable hydronephrosis on the left. There is no
ureteral calculus on the left. On the right, the kidney shows
diffuse abnormal enhancement with perinephric inflammation and
increased attenuation material throughout the renal pelvis. Superior
to the renal pelvis, there are several cystic areas, largest
measuring 3.2 x 2.7 cm. Hydronephrosis. There is irregular
thickening thickening throughout the right renal vein extending into
the inferior vena cava consistent with renal vein thrombosis. There
is a shaggy appearance to the right renal vein. There is
inflammatory appearing material surrounding the right renal artery.
The right renal artery is patent. There are no renal or ureteral
calculi on either side. The urinary bladder is midline with wall
thickness within normal limits.

Stomach/Bowel: There is no appreciable bowel wall or mesenteric
thickening. There is lipomatous infiltration of the ileocecal valve.
There is no bowel obstruction. No free air or portal venous air.

Vascular/Lymphatic: There is atherosclerotic calcification in the
aorta. There is no abdominal aortic aneurysm. The major mesenteric
vessels are patent with the exceptions of the abnormalities in the
right renal artery and vein noted above. There is a focal lymph node
there appears to be narrowing of the inferior vena cava superior to
the right renal vein. As noted above. There is question of tumor or
inflammatory material extending into the inferior vena cava via the
right renal vein. There is a focal lymph node between the aorta and
inferior vena cava measuring 1.2 x 1.0 cm. There are several smaller
retroperitoneal lymph nodes. No other adenopathy is appreciable in
the abdomen and pelvis.

Reproductive: The uterus and ovaries appear absent. There is no
pelvic mass or pelvic fluid collection.

Other: There is no periappendiceal region inflammation. There are no
omental lesions evident. There is no ascites in the abdomen or
pelvis. No well-defined abscess is identified in the abdomen or
pelvis.

Musculoskeletal: There are several small lytic lesions in the lumbar
spine consistent with metastatic foci. There is degenerative change
in the lower lumbar spine. There are no intramuscular or abdominal
wall lesions.
IMPRESSION: CT chest: Multiple nodular lesions throughout the lungs, most likely
due to metastatic foci. There is left hilar adenopathy, likely of
neoplastic etiology. Question duplication cyst in the middle
mediastinum in the sub- carinal region. Subtle lytic bone lesions
noted at multiple sites in the thoracic spine and proximal left
humerus. Cystic areas in each scapula most likely are of
arthropathic etiology.

There are multiple foci of coronary artery calcification noted.

CT abdomen and pelvis: There is diffuse abnormality involving the
right kidney with perinephric stranding. There is abnormal enhancing
material throughout the collecting system and proximal ureter on the
right with shaggy enhancing material encasing the right renal artery
vein. The right renal artery remains patent. The right renal vein
appears thrombosed. There is extension of this material into the
inferior vena cava. The appearance of the right kidney and
collecting system raises concern for neoplasm with transitional cell
carcinoma a consideration given the collecting system involvement.
An atypical presentation of renal cell carcinomas possible. There is
evidence of an inflammatory focus associated with these changes, and
inflammatory type carcinoma is felt to be most likely. An unusual
infection could present in this manner, although given the other
findings in the chest and abdomen, neoplasm is certainly felt to be
more likely. There is mild adenopathy in the retroperitoneum.

Suspect small liver metastases as noted above. There are subtle
lytic bony lesions in the lumbar spine.

No bowel obstruction. No renal or ureteral calculi. There is aortic
atherosclerosis.

Given these findings, urologic consultation advised.

These results will be called to the ordering clinician or
representative by the Radiologist Assistant, and communication
documented in the PACS or zVision Dashboard.

## 2017-08-23 IMAGING — DX DG CHEST 1V
1 series · 1 of 1 positions shown · non-contrast
Comparison: CT performed today

CLINICAL DATA: Right lung nodule biopsy.

EXAM:
CHEST 1 VIEW

[chest ap]
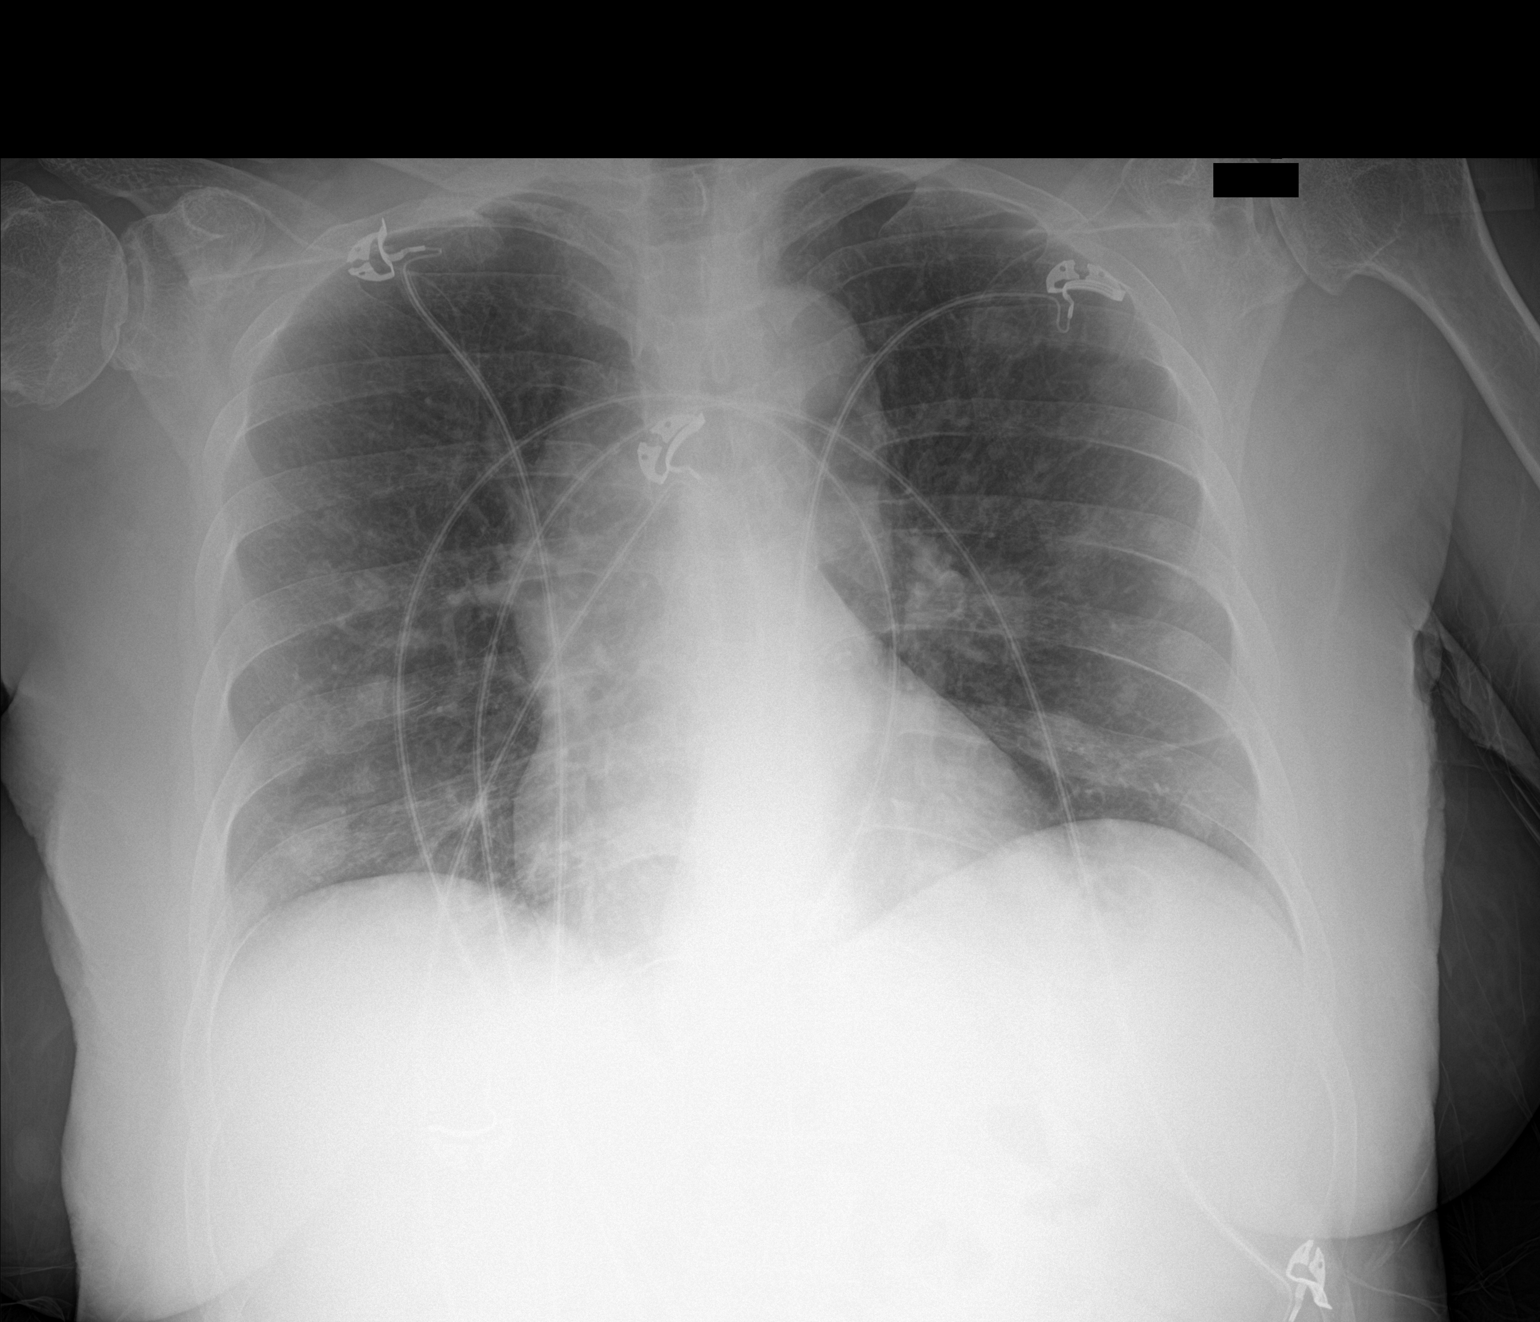

[1 of 1 positions shown; findings below may reference images not displayed]

FINDINGS: No visible pneumothorax. Multiple bilateral pulmonary nodules are
noted as seen on prior CT. Heart is normal size. No effusions.
IMPRESSION: No pneumothorax following right lung biopsy.
